# Patient Record
Sex: Female | Born: 1990 | Hispanic: Yes | Marital: Single | Smoking: Never smoker
Health system: Southern US, Community
[De-identification: ages and names within clinical notes are randomized; demographics above are authoritative.]

## PROBLEM LIST (undated history)

## (undated) DIAGNOSIS — Z789 Other specified health status: Secondary | ICD-10-CM

## (undated) HISTORY — PX: HERNIA REPAIR: SHX51

## (undated) HISTORY — DX: Other specified health status: Z78.9

---

## 2020-08-05 NOTE — Progress Notes (Signed)
Subjective:    Kristen Rice - 30 y.o. female MRN [id number]  Date of birth: Oct 29, 1990  HPI  Kristen Rice is to establish care. Patient has a PMH significant for none.   Current issues and/or concerns: 1. HIP PAIN: Duration: at least 7 months  Involved hip: left  Mechanism of injury: none Location: lateral Onset: gradual  Severity: 7/10  Quality: pinching , sharp and burning Frequency: intermittent Radiation: left lower back Aggravating factors: heavy lifting   Alleviating factors: ice  Status: fluctuating Treatments attempted: ice   Relief with NSAIDs?: No NSAIDs Taken Paresthesias / decreased sensation: no Swelling: no Redness:no Fevers: no  Comments: Requesting referral to Chiropractic.  2. 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 LIP SWELLING: Ongoing intermittently for at least 2 months. Happens suddenly. Burning and itching pain. Does not think this is related to food or drink. Denies changing shampoo, detergents, soaps, lotions, and perfumes. Denies difficulty breathing and chest pain when this occurs. Lasts about 3 days and then resolves.    ROS per HPI    Health Maintenance:  Health Maintenance Due  Topic Date Due  . Hepatitis C Screening  Never done  . HIV Screening  Never done    Past Medical History: There are no problems to display for this patient.  Social History   reports that she has never smoked. She has never used smokeless tobacco. She reports previous alcohol use. She reports that she does not use drugs.   Family History  Family history is unknown by patient.   Medications: reviewed and updated   Objective:   Physical Exam BP 113/77 (BP Location: Left Arm, Patient Position: Sitting)   Pulse 66   Ht 5' 4.33" (1.634 m)   Wt 159 lb 3.2 oz (72.2 kg)   SpO2 97%   BMI 27.05 kg/m  Physical Exam HENT:      Head: Normocephalic and atraumatic.     Nose: Nose normal.     Mouth/Throat:     Mouth: Mucous membranes are moist.     Pharynx: Oropharynx is clear.  Eyes:     Extraocular Movements: Extraocular movements intact.     Conjunctiva/sclera: Conjunctivae normal.     Pupils: Pupils are equal, round, and reactive to light.  Neurological:     Mental Status: She is alert.         Assessment & Plan:  1. Encounter to establish care: - Patient presents today to establish care.  - Return for annual physical examination, labs, and health maintenance. Arrive fasting meaning having no for at least 8 hours prior to appointment. You may have only water or black coffee. Please take scheduled medications as normal.  2. Chronic left-sided low back pain with left-sided sciatica: - Meloxicam as prescribed.  - Per patient request referral to Chiropractic for further evaluation and management. - Follow-up with primary provider as scheduled. - meloxicam (MOBIC) 7.5 MG tablet; Take 1 tablet (7.5 mg total) by mouth daily.  Dispense: 30 tablet; Refill: 0 - Ambulatory referral to Chiropractic  3. Bilateral carpal tunnel syndrome: - Meloxicam as prescribed.  - Follow-up with primary provider as scheduled. - meloxicam (MOBIC) 7.5 MG tablet; Take 1 tablet (7.5 mg total) by mouth daily.  Dispense: 30 tablet; Refill: 0  4. Lip swelling: - Ongoing intermittently for at least 2 months. Happens suddenly. Burning and itching pain. Does not think  this is related to food or drink. Denies changing shampoo, detergents, soaps, lotions, and perfumes. Denies difficulty breathing and chest pain when this occurs. Lasts about 3 days and then resolves.  - Suspicion for development of new onset allergies. - Referral to Allergy for further evaluation and  Management.  - Ambulatory referral to Allergy    Patient was given clear instructions to go to Emergency Department or return to medical center if symptoms don't improve,  worsen, or new problems develop.The patient verbalized understanding.  I discussed the assessment and treatment plan with the patient. The patient was provided an opportunity to ask questions and all were answered. The patient agreed with the plan and demonstrated an understanding of the instructions.   The patient was advised to call back or seek an in-person evaluation if the symptoms worsen or if the condition fails to improve as anticipated.    Ricky Stabs, NP 08/06/2020, 10:24 AM Primary Care at [facility]

## 2020-08-06 ENCOUNTER — Other Ambulatory Visit: Payer: Self-pay

## 2020-08-06 ENCOUNTER — Ambulatory Visit (INDEPENDENT_AMBULATORY_CARE_PROVIDER_SITE_OTHER): Payer: Medicaid Other | Admitting: Family

## 2020-08-06 ENCOUNTER — Encounter: Payer: Self-pay | Admitting: Family

## 2020-08-06 VITALS — BP 113/77 | HR 66 | Ht 64.33 in | Wt 159.2 lb

## 2020-08-06 DIAGNOSIS — R220 Localized swelling, mass and lump, head: Secondary | ICD-10-CM

## 2020-08-06 DIAGNOSIS — Z7689 Persons encountering health services in other specified circumstances: Secondary | ICD-10-CM

## 2020-08-06 DIAGNOSIS — G8929 Other chronic pain: Secondary | ICD-10-CM

## 2020-08-06 DIAGNOSIS — G5603 Carpal tunnel syndrome, bilateral upper limbs: Secondary | ICD-10-CM

## 2020-08-06 DIAGNOSIS — M5442 Lumbago with sciatica, left side: Secondary | ICD-10-CM | POA: Diagnosis not present

## 2020-08-06 MED ORDER — MELOXICAM 7.5 MG PO TABS: 7.5000 mg | ORAL_TABLET | Freq: Every day | ORAL | 0 refills | Status: DC

## 2020-08-06 NOTE — Progress Notes (Signed)
Establish care Pain radiating from left hip bone down leg Bottom lip swelling and redness around chin

## 2020-08-06 NOTE — Patient Instructions (Signed)
- Return for annual physical examination, labs, and health maintenance. Arrive fasting meaning having no for at least 8 hours prior to appointment. You may have only water or black coffee. Please take scheduled medications as normal. Thank you for choosing Primary [facility] at [facility] for your medical home!    Kristen Rice was seen by Rema Fendt, NP today.   Kristen Rice's primary care provider is Rema Fendt, NP.   For the best care possible,  you should try to see Ricky Stabs, NP whenever you come to clinic.   We look forward to seeing you again soon!  If you have any questions about your visit today,  please call us at [phone]  Or feel free to reach your provider via MyChart.    DASH Eating Plan DASH stands for Dietary Approaches to Stop Hypertension. The DASH eating plan is a healthy eating plan that has been shown to:  Reduce high blood pressure (hypertension).  Reduce your risk for type 2 diabetes, heart disease, and stroke.  Help with weight loss. What are tips for following this plan? Reading food labels  Check food labels for the amount of salt (sodium) per serving. Choose foods with less than 5 percent of the Daily Value of sodium. Generally, foods with less than 300 milligrams (mg) of sodium per serving fit into this eating plan.  To find whole grains, look for the word "whole" as the first word in the ingredient list. Shopping  Buy products labeled as "low-sodium" or "no salt added."  Buy fresh foods. Avoid canned foods and pre-made or frozen meals. Cooking  Avoid adding salt when cooking. Use salt-free seasonings or herbs instead of table salt or sea salt. Check with your health care provider or pharmacist before using salt substitutes.  Do not fry foods. Cook foods using healthy methods such as baking, boiling, grilling, roasting, and broiling instead.  Cook with heart-healthy oils, such as olive, canola, avocado, soybean, or sunflower  oil. Meal planning  Eat a balanced diet that includes: ? 4 or more servings of fruits and 4 or more servings of vegetables each day. Try to fill one-half of your plate with fruits and vegetables. ? 6-8 servings of whole grains each day. ? Less than 6 oz (170 g) of lean meat, poultry, or fish each day. A 3-oz (85-g) serving of meat is about the same size as a deck of cards. One egg equals 1 oz (28 g). ? 2-3 servings of low-fat dairy each day. One serving is 1 cup (237 mL). ? 1 serving of nuts, seeds, or beans 5 times each week. ? 2-3 servings of heart-healthy fats. Healthy fats called omega-3 fatty acids are found in foods such as walnuts, flaxseeds, fortified milks, and eggs. These fats are also found in cold-water fish, such as sardines, salmon, and mackerel.  Limit how much you eat of: ? Canned or prepackaged foods. ? Food that is high in trans fat, such as some fried foods. ? Food that is high in saturated fat, such as fatty meat. ? Desserts and other sweets, sugary drinks, and other foods with added sugar. ? Full-fat dairy products.  Do not salt foods before eating.  Do not eat more than 4 egg yolks a week.  Try to eat at least 2 vegetarian meals a week.  Eat more home-cooked food and less restaurant, buffet, and fast food.   Lifestyle  When eating at a restaurant, ask that your food be prepared  with less salt or no salt, if possible.  If you drink alcohol: ? Limit how much you use to:  0-1 drink a day for women who are not pregnant.  0-2 drinks a day for men. ? Be aware of how much alcohol is in your drink. In the [place] one drink equals one 12 oz bottle of beer (355 mL), one 5 oz glass of wine (148 mL), or one 1 oz glass of hard liquor (44 mL). General information  Avoid eating more than 2,300 mg of salt a day. If you have hypertension, you may need to reduce your sodium intake to 1,500 mg a day.  Work with your health care provider to maintain a healthy body weight or  to lose weight. Ask what an ideal weight is for you.  Get at least 30 minutes of exercise that causes your heart to beat faster (aerobic exercise) most days of the week. Activities may include walking, swimming, or biking.  Work with your health care provider or dietitian to adjust your eating plan to your individual calorie needs. What foods should I eat? Fruits All fresh, dried, or frozen fruit. Canned fruit in natural juice (without added sugar). Vegetables Fresh or frozen vegetables (raw, steamed, roasted, or grilled). Low-sodium or reduced-sodium tomato and vegetable juice. Low-sodium or reduced-sodium tomato sauce and tomato paste. Low-sodium or reduced-sodium canned vegetables. Grains Whole-grain or whole-wheat bread. Whole-grain or whole-wheat pasta. Brown rice. [place]. Bulgur. Whole-grain and low-sodium cereals. Pita bread. Low-fat, low-sodium crackers. Whole-wheat flour tortillas. Meats and other proteins Skinless chicken or [place]. Ground chicken or [place]. Pork with fat trimmed off. Fish and seafood. Egg whites. Dried beans, peas, or lentils. Unsalted nuts, nut butters, and seeds. Unsalted canned beans. Lean cuts of beef with fat trimmed off. Low-sodium, lean precooked or cured meat, such as sausages or meat loaves. Dairy Low-fat (1%) or fat-free (skim) milk. Reduced-fat, low-fat, or fat-free cheeses. Nonfat, low-sodium ricotta or cottage cheese. Low-fat or nonfat yogurt. Low-fat, low-sodium cheese. Fats and oils Soft margarine without trans fats. Vegetable oil. Reduced-fat, low-fat, or light mayonnaise and salad dressings (reduced-sodium). Canola, safflower, olive, avocado, soybean, and sunflower oils. Avocado. Seasonings and condiments Herbs. Spices. Seasoning mixes without salt. Other foods Unsalted popcorn and pretzels. Fat-free sweets. The items listed above may not be a complete list of foods and beverages you can eat. Contact a dietitian for more information. What  foods should I avoid? Fruits Canned fruit in a light or heavy syrup. Fried fruit. Fruit in cream or butter sauce. Vegetables Creamed or fried vegetables. Vegetables in a cheese sauce. Regular canned vegetables (not low-sodium or reduced-sodium). Regular canned tomato sauce and paste (not low-sodium or reduced-sodium). Regular tomato and vegetable juice (not low-sodium or reduced-sodium). Angie Fava. Olives. Grains Baked goods made with fat, such as croissants, muffins, or some breads. Dry pasta or rice meal packs. Meats and other proteins Fatty cuts of meat. Ribs. Fried meat. Berniece Salines. Bologna, salami, and other precooked or cured meats, such as sausages or meat loaves. Fat from the back of a pig (fatback). Bratwurst. Salted nuts and seeds. Canned beans with added salt. Canned or smoked fish. Whole eggs or egg yolks. Chicken or [place] with skin. Dairy Whole or 2% milk, cream, and half-and-half. Whole or full-fat cream cheese. Whole-fat or sweetened yogurt. Full-fat cheese. Nondairy creamers. Whipped toppings. Processed cheese and cheese spreads. Fats and oils Butter. Stick margarine. Lard. Shortening. Ghee. Bacon fat. Tropical oils, such as coconut, palm kernel, or palm oil. Seasonings and condiments  Onion salt, garlic salt, seasoned salt, table salt, and sea salt. Worcestershire sauce. Tartar sauce. Barbecue sauce. Teriyaki sauce. Soy sauce, including reduced-sodium. Steak sauce. Canned and packaged gravies. Fish sauce. Oyster sauce. Cocktail sauce. Store-bought horseradish. Ketchup. Mustard. Meat flavorings and tenderizers. Bouillon cubes. Hot sauces. Pre-made or packaged marinades. Pre-made or packaged taco seasonings. Relishes. Regular salad dressings. Other foods Salted popcorn and pretzels. The items listed above may not be a complete list of foods and beverages you should avoid. Contact a dietitian for more information. Where to find more information  National Heart, Lung, and Blood Institute:  https://wilson-eaton.com/  American Heart Association: www.heart.org  Academy of Nutrition and Dietetics: www.eatright.[place]: www.kidney.org Summary  The DASH eating plan is a healthy eating plan that has been shown to reduce high blood pressure (hypertension). It may also reduce your risk for type 2 diabetes, heart disease, and stroke.  When on the DASH eating plan, aim to eat more fresh fruits and vegetables, whole grains, lean proteins, low-fat dairy, and heart-healthy fats.  With the DASH eating plan, you should limit salt (sodium) intake to 2,300 mg a day. If you have hypertension, you may need to reduce your sodium intake to 1,500 mg a day.  Work with your health care provider or dietitian to adjust your eating plan to your individual calorie needs. This information is not intended to replace advice given to you by your health care provider. Make sure you discuss any questions you have with your health care provider. Document Revised: 03/09/2019 Document Reviewed: 03/09/2019 Elsevier Patient Education  2021 [employer].

## 2020-08-12 ENCOUNTER — Ambulatory Visit: Payer: Medicaid Other | Admitting: Allergy and Immunology

## 2020-08-20 DIAGNOSIS — L209 Atopic dermatitis, unspecified: Secondary | ICD-10-CM | POA: Diagnosis not present

## 2020-08-20 DIAGNOSIS — L811 Chloasma: Secondary | ICD-10-CM | POA: Diagnosis not present

## 2020-08-20 DIAGNOSIS — L299 Pruritus, unspecified: Secondary | ICD-10-CM | POA: Diagnosis not present

## 2020-10-21 ENCOUNTER — Ambulatory Visit: Payer: Medicaid Other | Admitting: Allergy and Immunology

## 2020-10-25 DIAGNOSIS — L209 Atopic dermatitis, unspecified: Secondary | ICD-10-CM | POA: Diagnosis not present

## 2020-10-25 DIAGNOSIS — L299 Pruritus, unspecified: Secondary | ICD-10-CM | POA: Diagnosis not present

## 2020-11-11 ENCOUNTER — Ambulatory Visit: Payer: Medicaid Other | Admitting: Physician Assistant

## 2020-11-19 ENCOUNTER — Ambulatory Visit
Admission: EM | Admit: 2020-11-19 | Discharge: 2020-11-19 | Disposition: A | Discharge status: Home / Self Care | Payer: Medicaid Other | Attending: Family Medicine | Admitting: Family Medicine

## 2020-11-19 ENCOUNTER — Other Ambulatory Visit: Payer: Self-pay

## 2020-11-19 DIAGNOSIS — L237 Allergic contact dermatitis due to plants, except food: Secondary | ICD-10-CM | POA: Diagnosis not present

## 2020-11-19 DIAGNOSIS — H65191 Other acute nonsuppurative otitis media, right ear: Secondary | ICD-10-CM | POA: Diagnosis not present

## 2020-11-19 MED ORDER — TRIAMCINOLONE ACETONIDE 0.1 % EX CREA: 1.0000 "application " | TOPICAL_CREAM | Freq: Two times a day (BID) | CUTANEOUS | 0 refills | Status: DC

## 2020-11-19 MED ORDER — PREDNISONE 10 MG PO TABS: ORAL_TABLET | ORAL | 0 refills | Status: DC

## 2020-11-19 NOTE — ED Provider Notes (Signed)
[facility]    CSN: [id number] Arrival date & time: 11/19/20  1119      History   Chief Complaint Chief Complaint  Patient presents with   Rash    HPI Kristen Rice is a 30 y.o. female.   Patient presenting today with 3-day history of itchy rash to bilateral lower extremities, upper extremities that is spreading over time.  Denies pain, drainage, known new exposures, chest pain, shortness of breath, history of dermatologic issues.  Has not tried anything over-the-counter for symptoms thus far.  Also states her right ear has been clogged with pressure and popping sensations for almost 10 days now.  Has not been tried anything over-the-counter for this either.  Denies fever, chills, headache, sharp constant pain, recent illness.   Past Medical History:  Diagnosis Date   No pertinent past medical history     There are no problems to display for this patient.   Past Surgical History:  Procedure Laterality Date   HERNIA REPAIR      OB History   No obstetric history on file.      Home Medications    Prior to Admission medications   Medication Sig Start Date End Date Taking? Authorizing Provider  predniSONE (DELTASONE) 10 MG tablet Take 6 tabs daily with breakfast x 2 days, 5 tabs daily x 2 days, 4 tabs daily x 2 days, etc 11/19/20  Yes Particia Nearing, PA-C  triamcinolone cream (KENALOG) 0.1 % Apply 1 application topically 2 (two) times daily. 11/19/20  Yes Particia Nearing, PA-C    Family History Family History  Family history unknown: Yes    Social History Social History   Tobacco Use   Smoking status: Never   Smokeless tobacco: Never  Vaping Use   Vaping Use: Never used  Substance Use Topics   Alcohol use: Not Currently   Drug use: Never     Allergies   Patient has no known allergies.   Review of Systems Review of Systems Per HPI  Physical Exam Triage Vital Signs ED Triage Vitals  Enc Vitals Group     BP 11/19/20 1257  134/74     Pulse Rate 11/19/20 1257 76     Resp 11/19/20 1257 18     Temp 11/19/20 1257 98.4 F (36.9 C)     Temp Source 11/19/20 1257 Oral     SpO2 11/19/20 1257 96 %     Weight --      Height --      Head Circumference --      Peak Flow --      Pain Score 11/19/20 1258 0     Pain Loc --      Pain Edu? --      Excl. in GC? --    No data found.  Updated Vital Signs BP 134/74 (BP Location: Left Arm)   Pulse 76   Temp 98.4 F (36.9 C) (Oral)   Resp 18   SpO2 96%   Visual Acuity Right Eye Distance:   Left Eye Distance:   Bilateral Distance:    Right Eye Near:   Left Eye Near:    Bilateral Near:     Physical Exam Vitals and nursing note reviewed.  Constitutional:      Appearance: Normal appearance. She is not ill-appearing.  HENT:     Head: Atraumatic.     Ears:     Comments: Bilateral middle ear effusion, no erythema or abnormal canal findings  Eyes:     Extraocular Movements: Extraocular movements intact.     Conjunctiva/sclera: Conjunctivae normal.  Cardiovascular:     Rate and Rhythm: Normal rate and regular rhythm.     Heart sounds: Normal heart sounds.  Pulmonary:     Effort: Pulmonary effort is normal.     Breath sounds: Normal breath sounds.  Musculoskeletal:        General: Normal range of motion.     Cervical back: Normal range of motion and neck supple.  Skin:    General: Skin is warm and dry.     Findings: Rash present. No erythema.     Comments: Erythematous maculopapular rash, linear and in clusters of bilateral medial legs and arms  Neurological:     Mental Status: She is alert and oriented to person, place, and time.  Psychiatric:        Mood and Affect: Mood normal.        Thought Content: Thought content normal.        Judgment: Judgment normal.     UC Treatments / Results  Labs (all labs ordered are listed, but only abnormal results are displayed) Labs Reviewed - No data to display  EKG   Radiology No results  found.  Procedures Procedures (including critical care time)  Medications Ordered in UC Medications - No data to display  Initial Impression / Assessment and Plan / UC Course  I have reviewed the triage vital signs and the nursing notes.  Pertinent labs & imaging results that were available during my care of the patient were reviewed by me and considered in my medical decision making (see chart for details).     Suspect poison ivy dermatitis for her itchy rash, will treat with extended prednisone taper given continued spread and widespread nature of distribution.  Triamcinolone cream additionally for spot treatment.  Discussed that the prednisone will also help open up her eustachian tubes which will help with her ear pressure and popping sensation.  Follow-up if worsening or not resolving  Final Clinical Impressions(s) / UC Diagnoses   Final diagnoses:  Acute middle ear effusion, right  Poison ivy dermatitis   Discharge Instructions   None    ED Prescriptions     Medication Sig Dispense Auth. Provider   predniSONE (DELTASONE) 10 MG tablet Take 6 tabs daily with breakfast x 2 days, 5 tabs daily x 2 days, 4 tabs daily x 2 days, etc 42 tablet Particia Nearing, PA-C   triamcinolone cream (KENALOG) 0.1 % Apply 1 application topically 2 (two) times daily. 90 g Particia Nearing, [place]      PDMP not reviewed this encounter.   Particia Nearing, [place] 11/19/20 1402

## 2020-11-19 NOTE — ED Triage Notes (Signed)
Pt c/o itchy red raised rash to arms and legs x3 days. Pt c/o rt ear clogged x8 days.

## 2020-12-09 ENCOUNTER — Ambulatory Visit: Payer: Medicaid Other | Admitting: Allergy

## 2020-12-09 NOTE — Progress Notes (Deleted)
New Patient Note  RE: Kristen Rice MRN: [id number] DOB: 18-Jul-1990 Date of Office Visit: 12/09/2020  Consult requested by: Rema Fendt, NP Primary care provider: Rema Fendt, NP  Chief Complaint: No chief complaint on file.  History of Present Illness: I had the pleasure of seeing Kristen Rice for initial evaluation at the [facility] on 12/09/2020. She is a 30 y.o. 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  Swelling started about *** ago. Mainly occurs on her ***. Describes them as ***. Individual swelling episodes last about ***. No ecchymosis upon resolution. Associated symptoms include: ***.  Frequency of episodes: ***. Suspected triggers are ***. Denies any *** fevers, chills, changes in medications, foods, personal care products or recent infections. She has tried the following therapies: *** with *** benefit. Systemic steroids ***. Currently on ***.  Previous work up includes: ***. Previous history of swelling: {Blank single:19197::"yes","no"}. Family history of angioedema: {Blank single:19197::"yes","no"}. Patient is up to date with the following cancer screening tests: ***. Ace-inhibitor use: {Blank single:19197::"yes","no"}  08/06/2020 PCP visit: "3. LIP SWELLING: Ongoing intermittently for at least 2 months. Happens suddenly. Burning and itching pain. Does not think this is related to food or drink. Denies changing shampoo, detergents, soaps, lotions, and perfumes. Denies difficulty breathing and chest pain when this occurs. Lasts about 3 days and then resolves.  - Ongoing intermittently for at least 2 months. Happens suddenly. Burning and itching pain. Does not think this is related to food or drink. Denies changing shampoo, detergents, soaps, lotions, and perfumes. Denies difficulty breathing and chest pain when this occurs. Lasts about 3 days and then resolves.  - Suspicion for development of new  onset allergies. - Referral to Allergy for further evaluation and  Management.  - Ambulatory referral to Allergy "  Assessment and Plan: Kristen Rice is a 30 y.o. female with: No problem-specific Assessment & Plan notes found for this encounter.  No follow-ups on file.  No orders of the defined types were placed in this encounter.  Lab Orders  No laboratory test(s) ordered today    Other allergy screening: Asthma: {Blank single:19197::"yes","no"} Rhino conjunctivitis: {Blank single:19197::"yes","no"} Food allergy: {Blank single:19197::"yes","no"} Medication allergy: {Blank single:19197::"yes","no"} Hymenoptera allergy: {Blank single:19197::"yes","no"} Urticaria: {Blank single:19197::"yes","no"} Eczema:{Blank single:19197::"yes","no"} History of recurrent infections suggestive of immunodeficency: {Blank single:19197::"yes","no"}  Diagnostics: Spirometry:  Tracings reviewed. Her effort: {Blank single:19197::"Good reproducible efforts.","It was hard to get consistent efforts and there is a question as to whether this reflects a maximal maneuver.","Poor effort, data can not be interpreted."} FVC: ***L FEV1: ***L, ***% predicted FEV1/FVC ratio: ***% Interpretation: {Blank single:19197::"Spirometry consistent with mild obstructive disease","Spirometry consistent with moderate obstructive disease","Spirometry consistent with severe obstructive disease","Spirometry consistent with possible restrictive disease","Spirometry consistent with mixed obstructive and restrictive disease","Spirometry uninterpretable due to technique","Spirometry consistent with normal pattern","No overt abnormalities noted given today's efforts"}.  Please see scanned spirometry results for details.  Skin Testing: {Blank single:19197::"Select foods","Environmental allergy panel","Environmental allergy panel and select foods","Food allergy panel","None","Deferred due to recent antihistamines use"}. *** Results discussed  with patient/family.   Past Medical History: There are no problems to display for this patient.  Past Medical History:  Diagnosis Date  . No pertinent past medical history    Past Surgical History: Past Surgical History:  Procedure Laterality Date  . HERNIA REPAIR     Medication List:  Current Outpatient Medications  Medication Sig Dispense Refill  . predniSONE (DELTASONE) 10 MG tablet Take  6 tabs daily with breakfast x 2 days, 5 tabs daily x 2 days, 4 tabs daily x 2 days, etc 42 tablet 0  . triamcinolone cream (KENALOG) 0.1 % Apply 1 application topically 2 (two) times daily. 90 g 0   No current facility-administered medications for this visit.   Allergies: No Known Allergies Social History: Social History   Socioeconomic History  . Marital status: Single    Spouse name: Not on file  . Number of children: Not on file  . Years of education: Not on file  . Highest education level: Not on file  Occupational History  . Not on file  Tobacco Use  . Smoking status: Never  . Smokeless tobacco: Never  Vaping Use  . Vaping Use: Never used  Substance and Sexual Activity  . Alcohol use: Not Currently  . Drug use: Never  . Sexual activity: Yes    Birth control/protection: I.U.D.  Other Topics Concern  . Not on file  Social History Narrative  . Not on file   Social Determinants of Health   Financial Resource Strain: Not on file  Food Insecurity: Not on file  Transportation Needs: Not on file  Physical Activity: Not on file  Stress: Not on file  Social Connections: Not on file   Lives in a ***. Smoking: *** Occupation: ***  Environmental HistorySurveyor, minerals in the house: Copywriter, advertising in the family room: {Blank single:19197::"yes","no"} Carpet in the bedroom: {Blank single:19197::"yes","no"} Heating: {Blank single:19197::"electric","gas","heat pump"} Cooling: {Blank single:19197::"central","window","heat pump"} Pet: {Blank  single:19197::"yes ***","no"}  Family History: Family History  Family history unknown: Yes   Problem                               Relation Asthma                                   *** Eczema                                *** Food allergy                          *** Allergic rhino conjunctivitis     ***  Review of Systems  Constitutional:  Negative for appetite change, chills, fever and unexpected weight change.  HENT:  Negative for congestion and rhinorrhea.   Eyes:  Negative for itching.  Respiratory:  Negative for cough, chest tightness, shortness of breath and wheezing.   Cardiovascular:  Negative for chest pain.  Gastrointestinal:  Negative for abdominal pain.  Genitourinary:  Negative for difficulty urinating.  Skin:  Negative for rash.  Neurological:  Negative for headaches.   Objective: There were no vitals taken for this visit. There is no height or weight on file to calculate BMI. Physical Exam Vitals and nursing note reviewed.  Constitutional:      Appearance: Normal appearance. She is well-developed.  HENT:     Head: Normocephalic and atraumatic.     Right Ear: Tympanic membrane and external ear normal.     Left Ear: Tympanic membrane and external ear normal.     Nose: Nose normal.     Mouth/Throat:     Mouth: Mucous membranes are moist.     Pharynx: Oropharynx is clear.  Eyes:     Conjunctiva/sclera: Conjunctivae normal.  Cardiovascular:     Rate and Rhythm: Normal rate and regular rhythm.     Heart sounds: Normal heart sounds. No murmur heard.   No friction rub. No gallop.  Pulmonary:     Effort: Pulmonary effort is normal.     Breath sounds: Normal breath sounds. No wheezing, rhonchi or rales.  Musculoskeletal:     Cervical back: Neck supple.  Skin:    General: Skin is warm.     Findings: No rash.  Neurological:     Mental Status: She is alert and oriented to person, place, and time.  Psychiatric:        Behavior: Behavior normal.  The plan was  reviewed with the patient/family, and all questions/concerned were addressed.  It was my pleasure to see Kristen Rice today and participate in her care. Please feel free to contact me with any questions or concerns.  Sincerely,  Wyline Mood, DO Allergy & Immunology  [facility] office: [phone] [facility] office: [phone]

## 2020-12-11 ENCOUNTER — Emergency Department (HOSPITAL_COMMUNITY)
Admission: EM | Admit: 2020-12-11 | Discharge: 2020-12-12 | Disposition: A | Discharge status: Home / Self Care | Payer: Medicaid Other | Attending: Emergency Medicine | Admitting: Emergency Medicine

## 2020-12-11 DIAGNOSIS — R1012 Left upper quadrant pain: Secondary | ICD-10-CM | POA: Insufficient documentation

## 2020-12-11 DIAGNOSIS — R109 Unspecified abdominal pain: Secondary | ICD-10-CM

## 2020-12-11 LAB — URINALYSIS, ROUTINE W REFLEX MICROSCOPIC
Bilirubin Urine: NEGATIVE
Glucose, UA: NEGATIVE mg/dL
Hgb urine dipstick: NEGATIVE
Ketones, ur: NEGATIVE mg/dL
Leukocytes,Ua: NEGATIVE
Nitrite: NEGATIVE
Protein, ur: NEGATIVE mg/dL
Specific Gravity, Urine: 1.02 (ref 1.005–1.030)
pH: 6 (ref 5.0–8.0)

## 2020-12-11 LAB — COMPREHENSIVE METABOLIC PANEL
ALT: 16 U/L (ref 0–44)
AST: 20 U/L (ref 15–41)
Albumin: 4 g/dL (ref 3.5–5.0)
Alkaline Phosphatase: 46 U/L (ref 38–126)
Anion gap: 6 (ref 5–15)
BUN: 16 mg/dL (ref 6–20)
CO2: 24 mmol/L (ref 22–32)
Calcium: 8.8 mg/dL — ABNORMAL LOW (ref 8.9–10.3)
Chloride: 105 mmol/L (ref 98–111)
Creatinine, Ser: 0.54 mg/dL (ref 0.44–1.00)
GFR, Estimated: >60 mL/min (ref >60)
Glucose, Bld: 100 mg/dL — ABNORMAL HIGH (ref 70–99)
Potassium: 3.8 mmol/L (ref 3.5–5.1)
Sodium: 135 mmol/L (ref 135–145)
Total Bilirubin: 0.7 mg/dL (ref 0.3–1.2)
Total Protein: 6.9 g/dL (ref 6.5–8.1)

## 2020-12-11 LAB — CBC WITH DIFFERENTIAL/PLATELET
Abs Immature Granulocytes: 0.01 10*3/uL (ref 0.00–0.07)
Basophils Absolute: 0 10*3/uL (ref 0.0–0.1)
Basophils Relative: 0 %
Eosinophils Absolute: 0.1 10*3/uL (ref 0.0–0.5)
Eosinophils Relative: 2 %
HCT: 41.7 % (ref 36.0–46.0)
Hemoglobin: 14.7 g/dL (ref 12.0–15.0)
Immature Granulocytes: 0 %
Lymphocytes Relative: 30 %
Lymphs Abs: 1.8 10*3/uL (ref 0.7–4.0)
MCH: 31.3 pg (ref 26.0–34.0)
MCHC: 35.3 g/dL (ref 30.0–36.0)
MCV: 88.9 fL (ref 80.0–100.0)
Monocytes Absolute: 0.4 10*3/uL (ref 0.1–1.0)
Monocytes Relative: 6 %
Neutro Abs: 3.6 10*3/uL (ref 1.7–7.7)
Neutrophils Relative %: 62 %
Platelets: 237 10*3/uL (ref 150–400)
RBC: 4.69 MIL/uL (ref 3.87–5.11)
RDW: 13.4 % (ref 11.5–15.5)
WBC: 5.9 10*3/uL (ref 4.0–10.5)
nRBC: 0 % (ref 0.0–0.2)

## 2020-12-11 LAB — LIPASE, BLOOD: Lipase: 33 U/L (ref 11–51)

## 2020-12-11 NOTE — Progress Notes (Signed)
Patient called at 42 and 1611. Voicemail left with both calls. [employer] assisted with today's call. Interpreter Name: Genella Rife, [place] #: [phone].

## 2020-12-11 NOTE — ED Provider Notes (Signed)
Emergency Medicine Provider Triage Evaluation Note  Kristen Rice , a 30 y.o. female  was evaluated in triage.  Pt complains of abd pain.  Review of Systems  Positive: Abd pain Negative: Fever, chills, dysuria, n/v/d/c, cp, sob  Physical Exam  BP 118/87   Pulse 78   Temp 98.6 F (37 C) (Oral)   Resp 16   SpO2 97%  Gen:   Awake, no distress   Resp:  Normal effort  MSK:   Moves extremities without difficulty  Other:  TTP LLQ  Medical Decision Making  Medically screening exam initiated at 7:59 PM.  Appropriate orders placed.  Kristen Rice was informed that the remainder of the evaluation will be completed by another provider, this initial triage assessment does not replace that evaluation, and the importance of remaining in the ED until their evaluation is complete.  Pt here with recurrent LLQ tenderness x 1 week.  No other associated sxs.  Sts she had had an endoscopy and colonoscopy in [place] earlier this year and it was normal.  Currently rates pain as 7/10.     Fayrene Helper, PA-C 12/11/20 2001    Koleen Distance, MD 12/11/20 2132

## 2020-12-11 NOTE — ED Provider Notes (Signed)
[facility] EMERGENCY DEPARTMENT Provider Note   CSN: [id number] Arrival date & time: 12/11/20  1938     History Chief Complaint  Patient presents with   Abdominal Pain   Back Pain    Kristen Rice is a 30 y.o. female.  30 year old female who presents to the emergency department today with persistent left upper quadrant abdominal pain.  Patient states that she has had this pain off and on for the last 6 months but it seems like over the last month and specifically over the last 10 days has become much more consistent and prominent.  At sharp sometimes pressure times sometimes bloating type pain.  She stated she had endoscopy in [place] when it started and this was negative for any ulcers or other abnormalities.  She states she had blood work done a couple times which was normal.  She is not on any medications for right now.  No nausea or vomiting.  No diarrhea or constipation.  No significant weight loss or weight gain.  At this moment she is asymptomatic patient stated when she came here about 4 5 hours ago she did have pain.  Is not related to eating.  No alcohol, tobacco or anti-inflammatories. No vaginal symptoms or urinary symptoms.   Abdominal Pain Back Pain Associated symptoms: abdominal pain       Past Medical History:  Diagnosis Date   No pertinent past medical history     There are no problems to display for this patient.   Past Surgical History:  Procedure Laterality Date   HERNIA REPAIR       OB History   No obstetric history on file.     Family History  Family history unknown: Yes    Social History   Tobacco Use   Smoking status: Never   Smokeless tobacco: Never  Vaping Use   Vaping Use: Never used  Substance Use Topics   Alcohol use: Not Currently   Drug use: Never    Home Medications Prior to Admission medications   Medication Sig Start Date End Date Taking? Authorizing Provider  Multiple Vitamin (MULTIVITAMIN WITH  MINERALS) TABS tablet Take 1 tablet by mouth daily.   Yes [provider]  pantoprazole (PROTONIX) 20 MG tablet Take 1 tablet (20 mg total) by mouth 2 (two) times daily for 14 days. 12/12/20 12/26/20 Yes Colby Catanese, Barbara Cower, MD  predniSONE (DELTASONE) 10 MG tablet Take 6 tabs daily with breakfast x 2 days, 5 tabs daily x 2 days, 4 tabs daily x 2 days, etc Patient not taking: Reported on 12/12/2020 11/19/20   Particia Nearing, PA-C  triamcinolone cream (KENALOG) 0.1 % Apply 1 application topically 2 (two) times daily. Patient not taking: Reported on 12/12/2020 11/19/20   Particia Nearing, PA-C    Allergies    Patient has no known allergies.  Review of Systems   Review of Systems  Gastrointestinal:  Positive for abdominal pain.  Musculoskeletal:  Positive for back pain.  All other systems reviewed and are negative.  Physical Exam Updated Vital Signs BP 109/72   Pulse (!) 59   Temp 98.8 F (37.1 C) (Oral)   Resp (!) 26   SpO2 99%   Physical Exam Vitals and nursing note reviewed.  Constitutional:      Appearance: She is well-developed.  HENT:     Head: Normocephalic and atraumatic.  Cardiovascular:     Rate and Rhythm: Normal rate and regular rhythm.  Pulmonary:  Effort: Pulmonary effort is normal. No respiratory distress.     Breath sounds: No stridor.  Abdominal:     General: Abdomen is flat. There is no distension.     Tenderness: There is abdominal tenderness in the left upper quadrant.  Musculoskeletal:     Cervical back: Normal range of motion.  Neurological:     Mental Status: She is alert.    ED Results / Procedures / Treatments   Labs (all labs ordered are listed, but only abnormal results are displayed) Labs Reviewed  COMPREHENSIVE METABOLIC PANEL - Abnormal; Notable for the following components:      Result Value   Glucose, Bld 100 (*)    Calcium 8.8 (*)    All other components within normal limits  CBC WITH DIFFERENTIAL/PLATELET  LIPASE, BLOOD   URINALYSIS, ROUTINE W REFLEX MICROSCOPIC  PREGNANCY, URINE    EKG None  Radiology CT ABDOMEN PELVIS W CONTRAST  Result Date: 12/12/2020 CLINICAL DATA:  Abdominal abscess/infection suspected. Unspecified abdominal pain. EXAM: CT ABDOMEN AND PELVIS WITH CONTRAST TECHNIQUE: Multidetector CT imaging of the abdomen and pelvis was performed using the standard protocol following bolus administration of intravenous contrast. CONTRAST:  64mL OMNIPAQUE IOHEXOL 350 MG/ML SOLN COMPARISON:  None. FINDINGS: Lower chest: No acute abnormality. Hepatobiliary: No focal liver abnormality is seen. Status post cholecystectomy. No biliary dilatation. Pancreas: Unremarkable Spleen: Unremarkable Adrenals/Urinary Tract: Adrenal glands are unremarkable. Kidneys are normal, without renal calculi, focal lesion, or hydronephrosis. Bladder is unremarkable. Stomach/Bowel: Stomach is within normal limits. Appendix appears normal. No evidence of bowel wall thickening, distention, or inflammatory changes. No free intraperitoneal gas or fluid. Vascular/Lymphatic: No significant vascular findings are present. No enlarged abdominal or pelvic lymph nodes. Reproductive: Intrauterine device in expected position within the endometrial cavity. Pelvic organs are otherwise unremarkable. Other: Umbilical hernia repair utilizing mesh has been performed. Rectum unremarkable. Musculoskeletal: No acute bone abnormality. No lytic or blastic bone lesions are identified. IMPRESSION: No acute intra-abdominal pathology identified. No definite radiographic explanation for the patient's reported symptoms. Electronically Signed   By: Helyn Numbers M.D.   On: 12/12/2020 03:56    Procedures Procedures   Medications Ordered in ED Medications  pantoprazole (PROTONIX) EC tablet 80 mg (80 mg Oral Given 12/12/20 0447)  iohexol (OMNIPAQUE) 350 MG/ML injection 75 mL (75 mLs Intravenous Contrast Given 12/12/20 0335)    ED Course  I have reviewed the triage  vital signs and the nursing notes.  Pertinent labs & imaging results that were available during my care of the patient were reviewed by me and considered in my medical decision making (see chart for details).    MDM Rules/Calculators/A&P                         Seems like indigestion/GERD but with the length of symptoms, worsening, negative EGD, will ct to ensure not splenic/pancreatic abnormality.   Ct ok. Will initiate PPI's.   Final Clinical Impression(s) / ED Diagnoses Final diagnoses:  Abdominal pain, unspecified abdominal location    Rx / DC Orders ED Discharge Orders          Ordered    pantoprazole (PROTONIX) 20 MG tablet  2 times daily        12/12/20 0440             Krystian Ferrentino, Barbara Cower, MD 12/12/20 [phone]

## 2020-12-11 NOTE — ED Triage Notes (Signed)
Pt c/o L sided abd pain, back pain x1wk. Denies fevers, chills, hematuria, dysuria. Denies injury to area, denies hx  8/10 swelling, pressure

## 2020-12-12 ENCOUNTER — Emergency Department (HOSPITAL_COMMUNITY): Payer: Medicaid Other

## 2020-12-12 DIAGNOSIS — R109 Unspecified abdominal pain: Secondary | ICD-10-CM | POA: Diagnosis not present

## 2020-12-12 LAB — PREGNANCY, URINE: Preg Test, Ur: NEGATIVE

## 2020-12-12 MED ORDER — PANTOPRAZOLE SODIUM 20 MG PO TBEC: 20.0000 mg | DELAYED_RELEASE_TABLET | Freq: Two times a day (BID) | ORAL | 0 refills | Status: DC

## 2020-12-12 MED ORDER — IOHEXOL 350 MG/ML SOLN
75.0000 mL | Freq: Once | INTRAVENOUS | Status: AC | PRN
  Administered 2020-12-12: 75 mL via INTRAVENOUS

## 2020-12-12 MED ORDER — PANTOPRAZOLE SODIUM 40 MG PO TBEC
80.0000 mg | DELAYED_RELEASE_TABLET | Freq: Every day | ORAL | Status: DC
  Administered 2020-12-12: 80 mg via ORAL
  Filled 2020-12-12: qty 2

## 2020-12-12 NOTE — ED Notes (Signed)
Patient verbalizes understanding of discharge instructions. Prescriptions reviewed. Opportunity for questioning and answers were provided. Armband removed by staff, pt discharged from ED ambulatory. ° °

## 2020-12-15 ENCOUNTER — Telehealth: Payer: Self-pay | Admitting: *Deleted

## 2020-12-15 ENCOUNTER — Encounter: Payer: Medicaid Other | Admitting: Family

## 2020-12-15 ENCOUNTER — Other Ambulatory Visit: Payer: Self-pay

## 2020-12-15 DIAGNOSIS — H5213 Myopia, bilateral: Secondary | ICD-10-CM | POA: Diagnosis not present

## 2020-12-15 DIAGNOSIS — Z789 Other specified health status: Secondary | ICD-10-CM

## 2020-12-15 NOTE — Telephone Encounter (Signed)
Transition Care Management Unsuccessful Follow-up Telephone Call  Date of discharge and from where:  12/12/2020 [facility]  Attempts:  1st Attempt  Reason for unsuccessful TCM follow-up call:  Left voice message via Spanish interpreter

## 2020-12-16 NOTE — Telephone Encounter (Signed)
Transition Care Management Follow-up Telephone Call Date of discharge and from where: 12/12/2020 - [facility] How have you been since you were released from the hospital? "I am okay" Any questions or concerns? No  Items Reviewed: Did the pt receive and understand the discharge instructions provided? Yes  Medications obtained and verified? Yes  Other? No  Any new allergies since your discharge? No  Dietary orders reviewed? No Do you have support at home? Yes    Functional Questionnaire: (I = Independent and D = Dependent) ADLs: I  Bathing/Dressing- I  Meal Prep- I  Eating- I  Maintaining continence- I  Transferring/Ambulation- I  Managing Meds- I  Follow up appointments reviewed:  PCP Hospital f/u appt confirmed? No  Specialist Hospital f/u appt confirmed? No   Are transportation arrangements needed? No  If their condition worsens, is the pt aware to call PCP or go to the Emergency Dept.? Yes Was the patient provided with contact information for the PCP's office or ED? Yes Was to pt encouraged to call back with questions or concerns? Yes  

## 2021-06-23 ENCOUNTER — Other Ambulatory Visit: Payer: Self-pay | Admitting: Nurse Practitioner

## 2021-06-23 ENCOUNTER — Telehealth: Payer: Self-pay

## 2021-06-23 ENCOUNTER — Other Ambulatory Visit: Payer: Self-pay

## 2021-06-23 ENCOUNTER — Encounter: Payer: Self-pay | Admitting: Family

## 2021-06-23 ENCOUNTER — Ambulatory Visit (INDEPENDENT_AMBULATORY_CARE_PROVIDER_SITE_OTHER): Payer: Medicaid Other | Admitting: Nurse Practitioner

## 2021-06-23 ENCOUNTER — Encounter: Payer: Self-pay | Admitting: Nurse Practitioner

## 2021-06-23 ENCOUNTER — Ambulatory Visit (INDEPENDENT_AMBULATORY_CARE_PROVIDER_SITE_OTHER): Payer: Medicaid Other

## 2021-06-23 VITALS — BP 121/73 | HR 77 | Resp 18

## 2021-06-23 DIAGNOSIS — M25521 Pain in right elbow: Secondary | ICD-10-CM

## 2021-06-23 DIAGNOSIS — M25552 Pain in left hip: Secondary | ICD-10-CM | POA: Diagnosis not present

## 2021-06-23 DIAGNOSIS — G8929 Other chronic pain: Secondary | ICD-10-CM

## 2021-06-23 DIAGNOSIS — L989 Disorder of the skin and subcutaneous tissue, unspecified: Secondary | ICD-10-CM

## 2021-06-23 DIAGNOSIS — M25511 Pain in right shoulder: Secondary | ICD-10-CM

## 2021-06-23 MED ORDER — TIZANIDINE HCL 4 MG PO TABS: 4.0000 mg | ORAL_TABLET | Freq: Four times a day (QID) | ORAL | 0 refills | Status: DC | PRN

## 2021-06-23 MED ORDER — PREDNISONE 10 MG PO TABS: ORAL_TABLET | ORAL | 0 refills | Status: DC

## 2021-06-23 NOTE — Telephone Encounter (Signed)
Called patient reviewed all information and repeated back to me. Will call if any questions.  ?With the interpretor Otho Darner # [phone] ?

## 2021-06-23 NOTE — Assessment & Plan Note (Addendum)
-   DG Elbow Complete Right ?- predniSONE (DELTASONE) 10 MG tablet; Take 4 tabs for 2 days, then 3 tabs for 2 days, then 2 tabs for 2 days, then 1 tab for 2 days, then stop  Dispense: 20 tablet; Refill: 0 ?- tiZANidine (ZANAFLEX) 4 MG tablet; Take 1 tablet (4 mg total) by mouth every 6 (six) hours as needed for muscle spasms.  Dispense: 30 tablet; Refill: 0 ? ?2. Chronic right shoulder pain ? ?- DG Shoulder Right ?- predniSONE (DELTASONE) 10 MG tablet; Take 4 tabs for 2 days, then 3 tabs for 2 days, then 2 tabs for 2 days, then 1 tab for 2 days, then stop  Dispense: 20 tablet; Refill: 0 ?- tiZANidine (ZANAFLEX) 4 MG tablet; Take 1 tablet (4 mg total) by mouth every 6 (six) hours as needed for muscle spasms.  Dispense: 30 tablet; Refill: 0 ? ?3. Chronic left hip pain ? ?- DG Hip Unilat W OR W/O Pelvis 2-3 Views Left ?- predniSONE (DELTASONE) 10 MG tablet; Take 4 tabs for 2 days, then 3 tabs for 2 days, then 2 tabs for 2 days, then 1 tab for 2 days, then stop  Dispense: 20 tablet; Refill: 0 ?- tiZANidine (ZANAFLEX) 4 MG tablet; Take 1 tablet (4 mg total) by mouth every 6 (six) hours as needed for muscle spasms.  Dispense: 30 tablet; Refill: 0 ? ? ?4. Skin lesion of hand ? ?- Ambulatory referral to Dermatology ? ? ?Follow up: ? ?Follow up 2-4 weeks with PCP ? ?

## 2021-06-23 NOTE — Patient Instructions (Addendum)
1. Right elbow pain ? ?- DG Elbow Complete Right - unable to complete ?- predniSONE (DELTASONE) 10 MG tablet; Take 4 tabs for 2 days, then 3 tabs for 2 days, then 2 tabs for 2 days, then 1 tab for 2 days, then stop  Dispense: 20 tablet; Refill: 0 ?- tiZANidine (ZANAFLEX) 4 MG tablet; Take 1 tablet (4 mg total) by mouth every 6 (six) hours as needed for muscle spasms.  Dispense: 30 tablet; Refill: 0 ? ?2. Chronic right shoulder pain ? ?- DG Shoulder Right ?- predniSONE (DELTASONE) 10 MG tablet; Take 4 tabs for 2 days, then 3 tabs for 2 days, then 2 tabs for 2 days, then 1 tab for 2 days, then stop  Dispense: 20 tablet; Refill: 0 ?- tiZANidine (ZANAFLEX) 4 MG tablet; Take 1 tablet (4 mg total) by mouth every 6 (six) hours as needed for muscle spasms.  Dispense: 30 tablet; Refill: 0 ? ?3. Chronic left hip pain ? ?- DG Hip Unilat W OR W/O Pelvis 2-3 Views Left ?- predniSONE (DELTASONE) 10 MG tablet; Take 4 tabs for 2 days, then 3 tabs for 2 days, then 2 tabs for 2 days, then 1 tab for 2 days, then stop  Dispense: 20 tablet; Refill: 0 ?- tiZANidine (ZANAFLEX) 4 MG tablet; Take 1 tablet (4 mg total) by mouth every 6 (six) hours as needed for muscle spasms.  Dispense: 30 tablet; Refill: 0 ? ? ?4. Skin lesion of hand ? ?- Ambulatory referral to Dermatology ? ? ?Follow up: ? ?Follow up 2-4 weeks with PCP ? ? ? ?

## 2021-06-23 NOTE — Telephone Encounter (Signed)
Left message to return call to our office.  ?With help from [facility] # [phone] ?

## 2021-06-23 NOTE — Addendum Note (Signed)
Addended by: Ivonne Andrew on: 06/23/2021 04:12 PM ? ? Modules accepted: Orders ? ?

## 2021-06-23 NOTE — Progress Notes (Addendum)
@Patient  ID: , female    DOB: 1990-08-29, 31 y.o.   MRN: [id number] ? ?Chief Complaint  ?Patient presents with  ? Hip Pain  ? ? ?Referring provider: ?[id number], NP ? ?HPI ? ?Patient presents today for joint pain.  She states that she felt last July.  She thinks that her joint pain may be coming as a result of this fall.  However her pain did not actually start until November 2022.  She states that she has been having left hip pain, right shoulder pain, right elbow pain.  Patient denies any decreased range of motion to joints.  She denies any joint instability.  Patient also complains today of a lesion to her right hand that is discolored and itching.  She is requesting to see a dermatologist for this issue.  We discussed that we can refer her to dermatology for this. Denies f/c/s, n/v/d, hemoptysis, PND, chest pain or edema. ? ? ?Note: X-ray tech was unable to complete right elbow x-ray.  Left hip x-ray and right shoulder x-ray were completed.  Patient was advised that she can return on another day to complete the right elbow x-ray if needed. ? ? ? ? ? ? ?No Known Allergies ? ?Immunization History  ?Administered Date(s) Administered  ? Tdap 09/17/2017  ? ? ?Past Medical History:  ?Diagnosis Date  ? No pertinent past medical history   ? ? ?Tobacco History: ?Social History  ? ?Tobacco Use  ?Smoking Status Never  ?Smokeless Tobacco Never  ? ?Counseling given: Yes ? ? ?Outpatient Encounter Medications as of 06/23/2021  ?Medication Sig  ? predniSONE (DELTASONE) 10 MG tablet Take 4 tabs for 2 days, then 3 tabs for 2 days, then 2 tabs for 2 days, then 1 tab for 2 days, then stop  ? tiZANidine (ZANAFLEX) 4 MG tablet Take 1 tablet (4 mg total) by mouth every 6 (six) hours as needed for muscle spasms.  ? Multiple Vitamin (MULTIVITAMIN WITH MINERALS) TABS tablet Take 1 tablet by mouth daily.  ? pantoprazole (PROTONIX) 20 MG tablet Take 1 tablet (20 mg total) by mouth 2 (two) times daily for 14 days.  ?  triamcinolone cream (KENALOG) 0.1 % Apply 1 application topically 2 (two) times daily. (Patient not taking: Reported on 12/12/2020)  ? [DISCONTINUED] predniSONE (DELTASONE) 10 MG tablet Take 6 tabs daily with breakfast x 2 days, 5 tabs daily x 2 days, 4 tabs daily x 2 days, etc (Patient not taking: Reported on 12/12/2020)  ? ?No facility-administered encounter medications on file as of 06/23/2021.  ? ? ? ?Review of Systems ? ?Review of Systems  ?Constitutional: Negative.   ?HENT: Negative.    ?Cardiovascular: Negative.   ?Gastrointestinal: Negative.   ?Musculoskeletal:  Positive for arthralgias.  ?     Chronic right shoulder pain, right elbow pain, and left hip pain since last November.  ?Allergic/Immunologic: Negative.   ?Neurological: Negative.   ?Psychiatric/Behavioral: Negative.     ? ? ? ?Physical Exam ? ?BP 121/73   Pulse 77   Resp 18   SpO2 98%  ? ?Wt Readings from Last 5 Encounters:  ?08/06/20 159 lb 3.2 oz (72.2 kg)  ? ? ? ?Physical Exam ?Vitals and nursing note reviewed.  ?Constitutional:   ?   General: She is not in acute distress. ?   Appearance: She is well-developed.  ?Cardiovascular:  ?   Rate and Rhythm: Normal rate and regular rhythm.  ?Pulmonary:  ?   Effort: Pulmonary effort is  normal.  ?   Breath sounds: Normal breath sounds.  ?Musculoskeletal:  ?   Right shoulder: Tenderness present. Normal range of motion. Normal strength.  ?   Right elbow: No swelling, deformity or effusion. Normal range of motion. Tenderness present.  ?   Left hip: Tenderness present. No deformity or lacerations. Normal range of motion. Normal strength.  ?Neurological:  ?   Mental Status: She is alert and oriented to person, place, and time.  ? ? ? ?Lab Results: ? ?CBC ?   ?Component Value Date/Time  ? WBC 5.9 12/11/2020 2003  ? RBC 4.69 12/11/2020 2003  ? HGB 14.7 12/11/2020 2003  ? HCT 41.7 12/11/2020 2003  ? PLT 237 12/11/2020 2003  ? MCV 88.9 12/11/2020 2003  ? MCH 31.3 12/11/2020 2003  ? MCHC 35.3 12/11/2020 2003  ? RDW  13.4 12/11/2020 2003  ? LYMPHSABS 1.8 12/11/2020 2003  ? MONOABS 0.4 12/11/2020 2003  ? EOSABS 0.1 12/11/2020 2003  ? BASOSABS 0.0 12/11/2020 2003  ? ? ?BMET ?   ?Component Value Date/Time  ? NA 135 12/11/2020 2003  ? K 3.8 12/11/2020 2003  ? CL 105 12/11/2020 2003  ? CO2 24 12/11/2020 2003  ? GLUCOSE 100 (H) 12/11/2020 2003  ? BUN 16 12/11/2020 2003  ? CREATININE 0.54 12/11/2020 2003  ? CALCIUM 8.8 (L) 12/11/2020 2003  ? GFRNONAA >60 12/11/2020 2003  ? ? ?BNP ?No results found for: BNP ? ?ProBNP ?No results found for: PROBNP ? ?Imaging: ?DG Shoulder Right ? ?Result Date: 06/23/2021 ?CLINICAL DATA:  Chronic right shoulder pain. EXAM: RIGHT SHOULDER - 2+ VIEW COMPARISON:  None. FINDINGS: The glenohumeral and acromioclavicular joint spaces are appropriately aligned and maintained. No acute fracture or dislocation. The visualized portion of the right lung is unremarkable. IMPRESSION: Normal right shoulder radiographs. Electronically Signed   By: Neita Garnet M.D.   On: 06/23/2021 15:20  ? ?DG Elbow Complete Right ? ?Result Date: 06/23/2021 ?CLINICAL DATA:  Chronic right elbow pain. EXAM: RIGHT ELBOW - COMPLETE 3+ VIEW COMPARISON:  None. FINDINGS: No joint effusion. Normal bone mineralization. Joint spaces are preserved. No acute fracture or dislocation. IMPRESSION: Normal right elbow radiographs. Electronically Signed   By: Neita Garnet M.D.   On: 06/23/2021 15:21  ? ?DG Hip Unilat W OR W/O Pelvis 2-3 Views Left ? ?Result Date: 06/23/2021 ?CLINICAL DATA:  Chronic left hip pain. EXAM: DG HIP (WITH OR WITHOUT PELVIS) 2-3V LEFT COMPARISON:  CT abdomen and pelvis 12/12/2020 FINDINGS: The bilateral sacroiliac and bilateral femoroacetabular joint spaces are maintained. There is again severe pubic symphysis joint space narrowing with high-grade subchondral sclerosis and subchondral degenerative cystic change including a subchondral cyst versus erosion measuring up to approximately 17 mm in craniocaudal dimension and 12 mm in  transverse dimension within the superior right pubic body. There is minimal superior right and inferior left pubic body offset, unchanged. No acute fracture or dislocation. An IUD overlies the midline pelvis similar to prior CT. 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 Subchondral degenerative cysts versus erosion within the superior right pubic body unchanged. Electronically Signed   By: Neita Garnet M.D.   On: 06/23/2021 15:23   ? ? ?Assessment & Plan:  ? ?Right elbow pain ?- DG Elbow Complete Right ?- predniSONE (DELTASONE) 10 MG tablet; Take 4 tabs for 2 days, then 3 tabs for 2 days, then 2 tabs for 2 days, then 1 tab for 2 days, then stop  Dispense: 20 tablet; Refill: 0 ?-  tiZANidine (ZANAFLEX) 4 MG tablet; Take 1 tablet (4 mg total) by mouth every 6 (six) hours as needed for muscle spasms.  Dispense: 30 tablet; Refill: 0 ? ?2. Chronic right shoulder pain ? ?- DG Shoulder Right ?- predniSONE (DELTASONE) 10 MG tablet; Take 4 tabs for 2 days, then 3 tabs for 2 days, then 2 tabs for 2 days, then 1 tab for 2 days, then stop  Dispense: 20 tablet; Refill: 0 ?- tiZANidine (ZANAFLEX) 4 MG tablet; Take 1 tablet (4 mg total) by mouth every 6 (six) hours as needed for muscle spasms.  Dispense: 30 tablet; Refill: 0 ? ?3. Chronic left hip pain ? ?- DG Hip Unilat W OR W/O Pelvis 2-3 Views Left ?- predniSONE (DELTASONE) 10 MG tablet; Take 4 tabs for 2 days, then 3 tabs for 2 days, then 2 tabs for 2 days, then 1 tab for 2 days, then stop  Dispense: 20 tablet; Refill: 0 ?- tiZANidine (ZANAFLEX) 4 MG tablet; Take 1 tablet (4 mg total) by mouth every 6 (six) hours as needed for muscle spasms.  Dispense: 30 tablet; Refill: 0 ? ? ?4. Skin lesion of hand ? ?- Ambulatory referral to Dermatology ? ? ?Follow up: ? ?Follow up 2-4 weeks with PCP ? ? ? ? ?Ivonne Andrew, NP ?06/23/2021 ? ?

## 2021-06-30 ENCOUNTER — Other Ambulatory Visit: Payer: Self-pay | Admitting: Nurse Practitioner

## 2021-06-30 ENCOUNTER — Encounter: Payer: Self-pay | Admitting: Family

## 2021-06-30 DIAGNOSIS — G8929 Other chronic pain: Secondary | ICD-10-CM

## 2021-06-30 NOTE — Telephone Encounter (Signed)
Referral placed  Thanks!

## 2021-07-01 ENCOUNTER — Telehealth: Payer: Self-pay | Admitting: Family

## 2021-07-01 ENCOUNTER — Ambulatory Visit: Payer: Medicaid Other | Admitting: Orthopaedic Surgery

## 2021-07-01 NOTE — Telephone Encounter (Signed)
Copied from CRM [id number]. Topic: Referral - Status ?>> Jul 01, 2021 11:59 AM Marylen Ponto wrote: ?Reason for CRM: Heather with [facility] reports that they are not in network with the patient's insurance. Cb# [phone] ?

## 2021-07-07 ENCOUNTER — Ambulatory Visit (INDEPENDENT_AMBULATORY_CARE_PROVIDER_SITE_OTHER): Payer: Medicaid Other | Admitting: Orthopaedic Surgery

## 2021-07-07 ENCOUNTER — Other Ambulatory Visit: Payer: Self-pay

## 2021-07-07 ENCOUNTER — Encounter: Payer: Self-pay | Admitting: Orthopaedic Surgery

## 2021-07-07 DIAGNOSIS — M25552 Pain in left hip: Secondary | ICD-10-CM

## 2021-07-07 DIAGNOSIS — M7711 Lateral epicondylitis, right elbow: Secondary | ICD-10-CM | POA: Diagnosis not present

## 2021-07-07 MED ORDER — DICLOFENAC SODIUM 75 MG PO TBEC: 75.0000 mg | DELAYED_RELEASE_TABLET | Freq: Two times a day (BID) | ORAL | 2 refills | Status: DC

## 2021-07-07 NOTE — Progress Notes (Signed)
? ?Office Visit Note ?  ?Patient: Kristen Rice           ?Date of Birth: June 09, 1990           ?MRN: [id number] ?Visit Date: 07/07/2021 ?             ?Requested by: Ivonne Andrew, NP ?[phone] ?[place],  [place] [address] ?PCP: Rema Fendt, NP ? ? ?Assessment & Plan: ?Visit Diagnoses:  ?1. Pain in left hip   ?2. Right tennis elbow   ? ? 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  In regards to the hips, we have discussed cortisone injection and physical therapy.  She is not interested in either.  In regards to the elbow, we have discussed providing her with a counterforce brace in addition to ECRB stretches.  She is agreeable to this plan.  She will follow-up with [place] as needed. ? ?Follow-Up Instructions: Return if symptoms worsen or fail to improve.  ? ?Orders:  ?No orders of the defined types were placed in this encounter. ? ?Meds ordered this encounter  ?Medications  ? diclofenac (VOLTAREN) 75 MG EC tablet  ?  Sig: Take 1 tablet (75 mg total) by mouth 2 (two) times daily.  ?  Dispense:  60 tablet  ?  Refill:  2  ? ? ? ? Procedures: ?No procedures performed ? ? ?Clinical Data: ?No additional findings. ? ? ?Subjective: ?Chief Complaint  ?Patient presents with  ? Left Hip - Pain  ? ? ?HPI patient is a 31 year old Spanish-speaking female who is here today with interpreter.  She is here with left hip pain and right elbow pain.  In regards to her left hip, she has had pain for the past year.  The majority is to the lateral aspect but she does note occasional pain radiating into the groin.  She describes this as constant without any specific aggravators.  She does not take medication for this.  She does note occasional numbness and tingling to the lateral hip.  She she has recent x-rays of the pelvis which showed severe pubic symphysis osteoarthritis  with subchondral degenerative cyst versus erosion.  No fevers or chills or any other constitutional symptoms.  She does note that she has given birth to 4 children.  In regards to her right elbow, she notes that she fell landing on the lateral aspect about 3 years ago.  She has had intermittent pain since.  The pain is to the lateral elbow worse with certain activities.  She has been using heat with mild relief. ? ?Review of Systems as detailed in HPI.  All others reviewed and are negative. ? ? ?Objective: ?Vital Signs: There were no vitals taken for this visit. ? ?Physical Exam well-developed well-nourished female no acute distress.  Alert and oriented x3. ? ?Ortho Exam hip exam shows marked tenderness to the greater trochanter.  She has limitation and pain with external rotation of the hip.  Positive logroll.  Negative straight leg raise.  She is neurovascular intact distally.  Right elbow exam shows moderate tenderness to the lateral epicondyle.  She has increased pain with gripping as well as with resisted long finger extension.  No focal weakness.  She is neurovascular intact distally. ? ?Specialty Comments:  ?No specialty comments available. ? ?Imaging: ?No new imaging ? ? ?PMFS History: ?Patient Active Problem List  ?  Diagnosis Date Noted  ? Right elbow pain 06/23/2021  ? ?Past Medical History:  ?Diagnosis Date  ? No pertinent past medical history   ?  ?Family History  ?Family history unknown: Yes  ?  ?Past Surgical History:  ?Procedure Laterality Date  ? HERNIA REPAIR    ? ?Social History  ? ?Occupational History  ? Not on file  ?Tobacco Use  ? Smoking status: Never  ? Smokeless tobacco: Never  ?Vaping Use  ? Vaping Use: Never used  ?Substance and Sexual Activity  ? Alcohol use: Not Currently  ? Drug use: Never  ? Sexual activity: Yes  ?  Birth control/protection: I.U.D.  ? ? ? ? ? ? ?

## 2021-07-08 ENCOUNTER — Encounter: Payer: Self-pay | Admitting: Family

## 2021-07-17 NOTE — Progress Notes (Signed)
Erroneous encounter

## 2021-07-21 ENCOUNTER — Encounter: Payer: Medicaid Other | Admitting: Family

## 2021-10-02 ENCOUNTER — Emergency Department (HOSPITAL_BASED_OUTPATIENT_CLINIC_OR_DEPARTMENT_OTHER)
Admission: EM | Admit: 2021-10-02 | Discharge: 2021-10-02 | Disposition: A | Discharge status: Home / Self Care | Payer: Medicaid Other | Attending: Emergency Medicine | Admitting: Emergency Medicine

## 2021-10-02 ENCOUNTER — Emergency Department (HOSPITAL_BASED_OUTPATIENT_CLINIC_OR_DEPARTMENT_OTHER): Payer: Medicaid Other | Admitting: Radiology

## 2021-10-02 ENCOUNTER — Encounter (HOSPITAL_BASED_OUTPATIENT_CLINIC_OR_DEPARTMENT_OTHER): Payer: Self-pay

## 2021-10-02 ENCOUNTER — Other Ambulatory Visit: Payer: Self-pay

## 2021-10-02 DIAGNOSIS — M79641 Pain in right hand: Secondary | ICD-10-CM | POA: Diagnosis not present

## 2021-10-02 DIAGNOSIS — M25521 Pain in right elbow: Secondary | ICD-10-CM | POA: Insufficient documentation

## 2021-10-02 DIAGNOSIS — M25531 Pain in right wrist: Secondary | ICD-10-CM | POA: Diagnosis not present

## 2021-10-02 DIAGNOSIS — M25511 Pain in right shoulder: Secondary | ICD-10-CM | POA: Insufficient documentation

## 2021-10-02 DIAGNOSIS — G8929 Other chronic pain: Secondary | ICD-10-CM | POA: Insufficient documentation

## 2021-10-02 MED ORDER — NAPROXEN 500 MG PO TABS: 500.0000 mg | ORAL_TABLET | Freq: Two times a day (BID) | ORAL | 0 refills | Status: AC

## 2021-10-02 MED ORDER — METHOCARBAMOL 500 MG PO TABS: 500.0000 mg | ORAL_TABLET | Freq: Every evening | ORAL | 0 refills | Status: AC

## 2021-10-02 NOTE — Discharge Instructions (Addendum)
You have been provided the contact information for local orthopedic specialist by the name of Dr. Victorino Dike.  Please call and schedule an appointment within the next few days for reevaluation and continued medical management.  You have also been provided 2 prescriptions, they are as follows: Naproxen-an anti-inflammatory.  You may take 1 tablet every 12 hours as needed for pain.  Do not take ibuprofen or Motrin at the same time, as these are in the same medication class.  Always take with plenty of food and water.  You may though take Tylenol at the same time as the naproxen. Robaxin-a muscle relaxant.  You may take 1 tablet before you go to bed for additional relief.  Do not drive or operate machinery within 6 to 8 hours, or right after taking this medication.  This has a side effect of drowsiness.  If you feel this is more than you can tolerate, you can stop taking this medication at any time.  Return to the ED for new or worsening symptoms as discussed.

## 2021-10-02 NOTE — ED Provider Notes (Signed)
[facility] EMERGENCY DEPT Provider Note   CSN: [id number] Arrival date & time: 10/02/21  1259     History  Chief Complaint  Patient presents with   Joint Pain    Kristen Rice is a 31 y.o. female with chief complaint of acute on chronic right shoulder and right elbow pain over the last 9-12 months.  Seen by PCP in March for same issue.  Described as intermittent.  States she fell 1 year ago, landing on her right hip and leg without notable injury to the right upper extremity.  Started having pain in the right upper extremity 3 months later.  Pain described as strong, and waxing and waning.  Denies numbness, recent fevers, tingling, weakness, or any other complaints at this time.  States range of motion is normal and does not appear to be affected.  Usually worse at night.  The history is provided by the patient and medical records.     Home Medications Prior to Admission medications   Medication Sig Start Date End Date Taking? Authorizing Provider  methocarbamol (ROBAXIN) 500 MG tablet Take 1 tablet (500 mg total) by mouth at bedtime for 7 days. 10/02/21 10/09/21 Yes Cecil Cobbs, PA-C  naproxen (NAPROSYN) 500 MG tablet Take 1 tablet (500 mg total) by mouth 2 (two) times daily. 10/02/21  Yes Cecil Cobbs, PA-C  Multiple Vitamin (MULTIVITAMIN WITH MINERALS) TABS tablet Take 1 tablet by mouth daily.    [provider]  pantoprazole (PROTONIX) 20 MG tablet Take 1 tablet (20 mg total) by mouth 2 (two) times daily for 14 days. 12/12/20 12/26/20  Mesner, Barbara Cower, MD  predniSONE (DELTASONE) 10 MG tablet Take 4 tabs for 2 days, then 3 tabs for 2 days, then 2 tabs for 2 days, then 1 tab for 2 days, then stop 06/23/21   Ivonne Andrew, NP  tiZANidine (ZANAFLEX) 4 MG tablet Take 1 tablet (4 mg total) by mouth every 6 (six) hours as needed for muscle spasms. 06/23/21   Ivonne Andrew, NP  triamcinolone cream (KENALOG) 0.1 % Apply 1 application topically 2 (two) times  daily. 11/19/20   Particia Nearing, PA-C      Allergies    Patient has no known allergies.    Review of Systems   Review of Systems  Musculoskeletal:        Pain of right shoulder, elbow, wrist   Physical Exam Updated Vital Signs BP 114/74   Pulse 80   Temp 98.2 F (36.8 C) (Oral)   Resp 16   Ht 5' 4.33" (1.634 m)   Wt 72.2 kg   SpO2 98%   BMI 27.04 kg/m  Physical Exam Vitals and nursing note reviewed.  Constitutional:      General: She is not in acute distress.    Appearance: Normal appearance. She is well-developed. She is not ill-appearing or diaphoretic.  HENT:     Head: Normocephalic and atraumatic.  Eyes:     Conjunctiva/sclera: Conjunctivae normal.  Neck:     Comments: Neck very supple on exam Cardiovascular:     Rate and Rhythm: Normal rate and regular rhythm.     Heart sounds: No murmur heard. Pulmonary:     Effort: Pulmonary effort is normal. No respiratory distress.     Breath sounds: Normal breath sounds.  Abdominal:     Palpations: Abdomen is soft.     Tenderness: There is no abdominal tenderness.  Musculoskeletal:        General:  No swelling or tenderness.     Cervical back: Neck supple. No rigidity or tenderness.     Right lower leg: No edema.     Left lower leg: No edema.     Comments: No appreciated significant tenderness or reduced ROM on exam.  No tenderness elicited with ROM.  No appreciated crepitus, obvious deformity, malalignment, ecchymosis, rash, erythema, edema, swelling.  Radial pulses 2+ bilaterally.  CRT less than 2 seconds.  Sensation appears intact.  Skin:    General: Skin is warm and dry.     Capillary Refill: Capillary refill takes less than 2 seconds.     Coloration: Skin is not pale.     Findings: No bruising or rash.  Neurological:     Mental Status: She is alert and oriented to person, place, and time.     Sensory: No sensory deficit.     Motor: No weakness.  Psychiatric:        Mood and Affect: Mood normal.    ED  Results / Procedures / Treatments   Labs (all labs ordered are listed, but only abnormal results are displayed) Labs Reviewed - No data to display  EKG None  Radiology DG Elbow Complete Right  Result Date: 10/02/2021 CLINICAL DATA:  Right wrist and elbow pain EXAM: RIGHT ELBOW - COMPLETE 3+ VIEW COMPARISON:  None Available. FINDINGS: No acute fracture or dislocation. No aggressive osseous lesion. Normal alignment. Soft tissue are unremarkable. No radiopaque foreign body or soft tissue emphysema. IMPRESSION: 1.  No acute osseous injury of the right elbow. Electronically Signed   By: Elige Ko M.D.   On: 10/02/2021 13:50   DG Shoulder Right  Result Date: 10/02/2021 CLINICAL DATA:  Pain without trauma EXAM: RIGHT SHOULDER - 2+ VIEW COMPARISON:  06/23/2021 FINDINGS: No acute fracture or dislocation. Joint spaces maintained. Visualized portion of the right hemithorax is normal. IMPRESSION: No acute osseous abnormality. Electronically Signed   By: Jeronimo Greaves M.D.   On: 10/02/2021 13:49   DG Wrist Complete Right  Result Date: 10/02/2021 CLINICAL DATA:  Pain for 8 months EXAM: RIGHT WRIST - COMPLETE 3+ VIEW COMPARISON:  None Available. FINDINGS: No distal radius or ulnar fracture. Radiocarpal joint is intact. No carpal fracture. No soft tissue abnormality. IMPRESSION: No acute or chronic osseous abnormality. Electronically Signed   By: Genevive Bi M.D.   On: 10/02/2021 13:48    Procedures Procedures    Medications Ordered in ED Medications - No data to display  ED Course/ Medical Decision Making/ A&P                           Medical Decision Making Amount and/or Complexity of Data Reviewed External Data Reviewed: notes. Labs: ordered. Decision-making details documented in ED Course. Radiology: ordered and independent interpretation performed. Decision-making details documented in ED Course. ECG/medicine tests: ordered and independent interpretation performed. Decision-making  details documented in ED Course.  Risk OTC drugs. Prescription drug management.   31 y.o. female presents to the ED for concern of Joint Pain   This involves an extensive number of treatment options, and is a complaint that carries with it a high risk of complications and morbidity.  The emergent differential diagnosis prior to evaluation includes, but is not limited to: Fracture, dislocation, contusion, impingement  This is not an exhaustive differential.   Past Medical History / Co-morbidities / Social History: Hx prior surgical hernia repair.  Additional History:  Internal and external  records from outside source obtained and reviewed including Family medicine  Physical Exam: Physical exam performed. The pertinent findings include: Overall unremarkable.  Lab Tests: None  Imaging Studies: I ordered imaging studies including x-ray imaging of right shoulder, elbow, and wrist .  I independently visualized and interpreted said imaging.  Pertinent results include: No acute bony pathology, fracture, dislocation. I agree with the radiologist interpretation.  Medications: I have reviewed the patients home medicines and have made adjustments as needed  ED Course/Disposition: Pt well-appearing on exam.  Presenting with chronic right upper extremity pain.  Suspicious of possible radiculopathy type symptoms.  Patient X-Rays negative for obvious pathology, fracture, or dislocation.  Upper extremity without significant exam findings, appears neurovascularly intact.  Suspicious of benign etiology such as cubital tunnel syndrome VS carpal tunnel syndrome VS cervical radiculopathy.  Low suspicion for CVA, ACS, cellulitis, compartment syndrome, DVT, or ischemia.  Offered pain management or bracing in the ED, for which the patient declined.  Pt advised to follow up with orthopedics for further evaluation.  Conservative therapy recommended and discussed.  Patient in NAD in good condition at time of  discharge.  After consideration of the diagnostic results and the patient's encounter today, I feel that the emergency department workup does not suggest an emergent condition requiring admission or immediate intervention beyond what has been performed at this time.  The patient is safe for discharge and has been instructed to return immediately for worsening symptoms, change in symptoms or any other concerns.  Discussed course of treatment thoroughly with the patient, whom demonstrated understanding.  Patient in agreement and has no further questions.  I discussed this case with my attending physician Dr. Rosalia Hammers, who agreed with the proposed treatment course and cosigned this note including patient's presenting symptoms, physical exam, and planned diagnostics and interventions.  Attending physician stated agreement with plan or made changes to plan which were implemented.     This chart was dictated using voice recognition software.  Despite best efforts to proofread, errors can occur which can change the documentation meaning.         Final Clinical Impression(s) / ED Diagnoses Final diagnoses:  Chronic right shoulder pain  Chronic elbow pain, right  Chronic hand pain, right    Rx / DC Orders ED Discharge Orders          Ordered    methocarbamol (ROBAXIN) 500 MG tablet  Nightly        10/02/21 1501    naproxen (NAPROSYN) 500 MG tablet  2 times daily        10/02/21 1501              Cecil Cobbs, Cordelia Poche 10/02/21 2318    Margarita Grizzle, MD 10/05/21 1217

## 2021-10-02 NOTE — ED Triage Notes (Signed)
Patient here POV from Home.  Endorses Pain to Right Shoulder and Right Elbow for 8-9 Months. Seen by PCP in March for Same and was told she would be sent for Studies but has been.   Pain is Intermittent in [place] and recalls No recent or Specific Trauma.   NAD Noted during Triage. A&Ox4. GCS 15. Ambulatory.

## 2021-11-15 DIAGNOSIS — B349 Viral infection, unspecified: Secondary | ICD-10-CM | POA: Diagnosis not present

## 2021-11-15 DIAGNOSIS — R051 Acute cough: Secondary | ICD-10-CM | POA: Diagnosis not present

## 2021-11-15 DIAGNOSIS — J029 Acute pharyngitis, unspecified: Secondary | ICD-10-CM | POA: Diagnosis not present

## 2021-11-15 DIAGNOSIS — Z20822 Contact with and (suspected) exposure to covid-19: Secondary | ICD-10-CM | POA: Diagnosis not present

## 2021-12-18 NOTE — Progress Notes (Deleted)
Virtual Visit via Telephone Note  I connected with Kristen Rice, on 12/18/2021 at 8:41 AM by telephone and verified that I am speaking with the correct person using two identifiers.  Consent: I discussed the limitations, risks, security and privacy concerns of performing an evaluation and management service by telephone and the availability of in person appointments. I also discussed with the patient that there may be a patient responsible charge related to this service. The patient expressed understanding and agreed to proceed.   Location of Patient: Home  Location of Provider: [facility] at [facility]   Persons participating in Telemedicine visit: Anairis Vega Rice Ricky Stabs, NP Margorie John, CMA   History of Present Illness: Kristen Rice is a 31 year-old female who presents for medication refills. Unsure which meds?...  gyno referral request     Past Medical History:  Diagnosis Date   No pertinent past medical history    No Known Allergies  Current Outpatient Medications on File Prior to Visit  Medication Sig Dispense Refill   Multiple Vitamin (MULTIVITAMIN WITH MINERALS) TABS tablet Take 1 tablet by mouth daily.     naproxen (NAPROSYN) 500 MG tablet Take 1 tablet (500 mg total) by mouth 2 (two) times daily. 30 tablet 0   pantoprazole (PROTONIX) 20 MG tablet Take 1 tablet (20 mg total) by mouth 2 (two) times daily for 14 days. 28 tablet 0   predniSONE (DELTASONE) 10 MG tablet Take 4 tabs for 2 days, then 3 tabs for 2 days, then 2 tabs for 2 days, then 1 tab for 2 days, then stop 20 tablet 0   tiZANidine (ZANAFLEX) 4 MG tablet Take 1 tablet (4 mg total) by mouth every 6 (six) hours as needed for muscle spasms. 30 tablet 0   triamcinolone cream (KENALOG) 0.1 % Apply 1 application topically 2 (two) times daily. 90 g 0   No current facility-administered medications on file prior to visit.    Observations/Objective: Alert and oriented x 3. Not in  acute distress. Physical examination not completed as this is a telemedicine visit.  Assessment and Plan: ***  Follow Up Instructions: ***   Patient was given clear instructions to go to Emergency Department or return to medical center if symptoms don't improve, worsen, or new problems develop.The patient verbalized understanding.  I discussed the assessment and treatment plan with the patient. The patient was provided an opportunity to ask questions and all were answered. The patient agreed with the plan and demonstrated an understanding of the instructions.   The patient was advised to call back or seek an in-person evaluation if the symptoms worsen or if the condition fails to improve as anticipated.     I provided *** minutes total of non-face-to-face time during this encounter.   Rema Fendt, NP  [facility] [place], [place] [phone] 12/18/2021, 8:41 AM

## 2021-12-19 NOTE — Progress Notes (Unsigned)
Patient ID: Kristen Rice, female    DOB: 17-May-1990  MRN: [id number]  CC: No chief complaint on file.   Subjective: Kristen Rice is a 31 y.o. female who presents for  Her concerns today include:   Gyn referral   Med refills?  Patient Active Problem List   Diagnosis Date Noted   Right elbow pain 06/23/2021     Current Outpatient Medications on File Prior to Visit  Medication Sig Dispense Refill   Multiple Vitamin (MULTIVITAMIN WITH MINERALS) TABS tablet Take 1 tablet by mouth daily.     naproxen (NAPROSYN) 500 MG tablet Take 1 tablet (500 mg total) by mouth 2 (two) times daily. 30 tablet 0   pantoprazole (PROTONIX) 20 MG tablet Take 1 tablet (20 mg total) by mouth 2 (two) times daily for 14 days. 28 tablet 0   predniSONE (DELTASONE) 10 MG tablet Take 4 tabs for 2 days, then 3 tabs for 2 days, then 2 tabs for 2 days, then 1 tab for 2 days, then stop 20 tablet 0   tiZANidine (ZANAFLEX) 4 MG tablet Take 1 tablet (4 mg total) by mouth every 6 (six) hours as needed for muscle spasms. 30 tablet 0   triamcinolone cream (KENALOG) 0.1 % Apply 1 application topically 2 (two) times daily. 90 g 0   No current facility-administered medications on file prior to visit.    No Known Allergies  Social History   Socioeconomic History   Marital status: Single    Spouse name: Not on file   Number of children: Not on file   Years of education: Not on file   Highest education level: Not on file  Occupational History   Not on file  Tobacco Use   Smoking status: Never   Smokeless tobacco: Never  Vaping Use   Vaping Use: Never used  Substance and Sexual Activity   Alcohol use: Not Currently   Drug use: Never   Sexual activity: Yes    Birth control/protection: I.U.D.  Other Topics Concern   Not on file  Social History Narrative   Not on file   Social Determinants of Health   Financial Resource Strain: Not on file  Food Insecurity: Not on file  Transportation Needs: Not on  file  Physical Activity: Not on file  Stress: Not on file  Social Connections: Not on file  Intimate Partner Violence: Not on file    Family History  Family history unknown: Yes    Past Surgical History:  Procedure Laterality Date   HERNIA REPAIR      ROS: Review of Systems Negative except as stated above  PHYSICAL EXAM: There were no vitals taken for this visit.  Physical Exam  {female adult master:310786} {female adult master:310785}     Latest Ref Rng & Units 12/11/2020    8:03 PM  CMP  Glucose 70 - 99 mg/dL 409   BUN 6 - 20 mg/dL 16   Creatinine 8.11 - 1.00 mg/dL 9.14   Sodium 782 - 956 mmol/L 135   Potassium 3.5 - 5.1 mmol/L 3.8   Chloride 98 - 111 mmol/L 105   CO2 22 - 32 mmol/L 24   Calcium 8.9 - 10.3 mg/dL 8.8   Total Protein 6.5 - 8.1 g/dL 6.9   Total Bilirubin 0.3 - 1.2 mg/dL 0.7   Alkaline Phos 38 - 126 U/L 46   AST 15 - 41 U/L 20   ALT 0 - 44 U/L 16  Lipid Panel  No results found for: "CHOL", "TRIG", "HDL", "CHOLHDL", "VLDL", "LDLCALC", "LDLDIRECT"  CBC    Component Value Date/Time   WBC 5.9 12/11/2020 2003   RBC 4.69 12/11/2020 2003   HGB 14.7 12/11/2020 2003   HCT 41.7 12/11/2020 2003   PLT 237 12/11/2020 2003   MCV 88.9 12/11/2020 2003   MCH 31.3 12/11/2020 2003   MCHC 35.3 12/11/2020 2003   RDW 13.4 12/11/2020 2003   LYMPHSABS 1.8 12/11/2020 2003   MONOABS 0.4 12/11/2020 2003   EOSABS 0.1 12/11/2020 2003   BASOSABS 0.0 12/11/2020 2003    ASSESSMENT AND PLAN:  There are no diagnoses linked to this encounter.   Patient was given the opportunity to ask questions.  Patient verbalized understanding of the plan and was able to repeat key elements of the plan. Patient was given clear instructions to go to Emergency Department or return to medical center if symptoms don't improve, worsen, or new problems develop.The patient verbalized understanding.   No orders of the defined types were placed in this encounter.    Requested  Prescriptions    No prescriptions requested or ordered in this encounter    No follow-ups on file.  Kristen Fendt, NP

## 2021-12-22 ENCOUNTER — Ambulatory Visit (INDEPENDENT_AMBULATORY_CARE_PROVIDER_SITE_OTHER): Payer: Medicaid Other | Admitting: Family

## 2021-12-22 ENCOUNTER — Encounter: Payer: Self-pay | Admitting: Family

## 2021-12-22 ENCOUNTER — Telehealth: Payer: Self-pay | Admitting: Family

## 2021-12-22 VITALS — BP 119/84 | HR 72 | Temp 98.0°F | Resp 16 | Ht 64.0 in | Wt 151.0 lb

## 2021-12-22 DIAGNOSIS — Z975 Presence of (intrauterine) contraceptive device: Secondary | ICD-10-CM | POA: Diagnosis not present

## 2021-12-22 DIAGNOSIS — F419 Anxiety disorder, unspecified: Secondary | ICD-10-CM | POA: Diagnosis not present

## 2021-12-22 DIAGNOSIS — G8929 Other chronic pain: Secondary | ICD-10-CM | POA: Diagnosis not present

## 2021-12-22 DIAGNOSIS — L299 Pruritus, unspecified: Secondary | ICD-10-CM

## 2021-12-22 DIAGNOSIS — F32A Depression, unspecified: Secondary | ICD-10-CM

## 2021-12-22 DIAGNOSIS — M5442 Lumbago with sciatica, left side: Secondary | ICD-10-CM

## 2021-12-22 MED ORDER — MELOXICAM 7.5 MG PO TABS: 7.5000 mg | ORAL_TABLET | Freq: Every day | ORAL | 1 refills | Status: AC

## 2021-12-22 MED ORDER — HYDROXYZINE PAMOATE 25 MG PO CAPS: 25.0000 mg | ORAL_CAPSULE | Freq: Every day | ORAL | 1 refills | Status: AC

## 2021-12-22 NOTE — Patient Instructions (Signed)
Intrauterine Device Insertion An intrauterine device (IUD) is a medical device that is inserted into the uterus to prevent pregnancy. It is a small, T-shaped device that has one or two nylon strings hanging down from it. The strings hang out of the lower part of the uterus (cervix) to allow for future IUD removal. There are two types of IUDs: Hormone IUD. This type of IUD is made of plastic and contains the hormone progestin (synthetic progesterone). A hormone IUD may last 3-5 years, depending on which one you have. Synthetic progesterone prevents pregnancy by: Thickening cervical mucus to prevent sperm from entering the uterus. Thinning the uterine lining to prevent a fertilized egg from implanting there. Copper IUD. This type of IUD has copper wire wrapped around it. A copper IUD may last up to 10 years. Copper prevents pregnancy by making the uterus and fallopian tubes produce a fluid that kills sperm. Tell a health care provider about: Any allergies you have. All medicines you are taking, including vitamins, herbs, eye drops, creams, and over-the-counter medicines. Any surgeries you have had. Any medical conditions you have, including any sexually transmitted infections (STIs) you may have. Whether you are pregnant or may be pregnant. What are the risks? Generally, this is a safe procedure. However, problems may occur, including: Infection. Bleeding. Allergic reactions to medicines. Puncture (perforation) of the uterus or damage to other structures or organs. Accidental placement of the IUD either in the muscle layer of the uterus (myometrium) or outside the uterus. The IUD falling out of the uterus (expulsion). This is more common among women who have recently had a child. Higher risk of an egg being fertilized outside your uterus (ectopic pregnancy).This is rare. Pelvic inflammatory disease (PID), which is an infection in the uterus and fallopian tubes. The IUD does not cause the  infection. The infection is usually from an unknown sexually transmitted infection (STI). This is rare, and it usually happens during the first 20 days after the IUD is inserted. What happens before the procedure? Ask your health care provider about: Changing or stopping your regular medicines. This is especially important if you are taking diabetes medicines or blood thinners. Taking over-the-counter medicines, vitamins, herbs, and supplements. Talk with your health care provider about when to schedule your IUD placement. Your health care provider may recommend taking over-the-counter pain medicines before the procedure. These medicines include ibuprofen and naproxen. You may have tests for: Pregnancy. A pregnancy test involves having a urine or blood sample taken. Sexually transmitted infections (STIs). Placing an IUD in someone who has an STI can make the infection worse. Cervical cancer. You may have a Pap test to check for this type of cancer. This means collecting cells from your cervix to be checked under a microscope. You may have a physical exam to determine the size and position of your uterus. What happens during the procedure? A tool (speculum) will be placed in your vagina and widened so that your health care provider can see your cervix. Medicine, or antiseptic, may be applied to your cervix to help lower your risk of infection. You may be given an anesthetic medicine to numb each side of your cervix. This medicine is usually given by an injection into the cervix. A tool called a uterine sound will be inserted into your uterus to check the length of your uterus and the direction that your uterus may be tilted. A slim instrument or tube (IUD inserter) that holds the IUD will be inserted into your vagina,   through your cervical canal, and into your uterus. The IUD will be placed in the uterus, and the IUD inserter will be removed. The strings that are attached to the IUD will be trimmed  so that they lie just below the cervix. The speculum will be removed. The procedure may vary among health care providers and hospitals. What can I expect after procedure? You may have bleeding after the procedure. This is normal. It varies from light bleeding (spotting) for a few days to menstrual-like bleeding. You may have cramping and pain in the abdomen. You may feel dizzy or light-headed. You may have lower back pain. You may have headaches and nausea. Follow these instructions at home: Before resuming sexual activity, check to make sure that you can feel the IUD string or strings. You should be able to feel the end of the string below the opening of your cervix. If your IUD string is in place, you may resume sexual activity. If you had a hormonal IUD inserted more than 7 days after your most recent period started, you will need to use a backup method of birth control for 7 days after IUD insertion. Ask your health care provider whether this applies to you. Continue to check that the IUD is still in place by feeling for the strings after every menstrual period, or once a month. An IUD will not protect you from sexually transmitted infections (STIs). Use methods to prevent the exchange of body fluids between partners (barrier protection) every time you have sex. Barrier protection can be used during oral, vaginal, or anal sex. Commonly used barrier methods include: Female condom. Female condom. Dental dam. Take over-the-counter and prescription medicines only as told by your health care provider. Keep all follow-up visits. This is important. Contact a health care provider if: You feel light-headed or weak. You have any of the following problems with your IUD string or strings: The string bothers or hurts you or your sexual partner. You cannot feel the string. The string has gotten longer. You can feel the IUD in your vagina. You think you may be pregnant, or you miss your menstrual  period. You think you may have a sexually transmitted infection (STI). Get help right away if you: You have flu-like symptoms, such as tiredness (fatigue) and muscle aches. You have a fever and chills. You have bleeding that is heavier or lasts longer than a normal menstrual cycle. You have abnormal or bad-smelling discharge from your vagina. You develop abdominal pain that is new, is getting worse, or is not in the same area of earlier cramping and pain. You have pain during sexual activity. Summary An intrauterine device (IUD) is a small, T-shaped device that has one or two nylon strings hanging down from it. You may have a copper IUD or a hormone IUD. Ask your health care provider what you need to do before the procedure. You may have some tests and you may have to change or stop some medicines. You may have bleeding after the procedure. This is normal. It varies from light spotting for a few days to menstrual-like bleeding. Check to make sure that you can feel the IUD strings before you resume sexual activity. Check the strings after every menstrual period or once a month. An IUD does not protect against STIs. Use other methods to protect yourself against infections. This information is not intended to replace advice given to you by your health care provider. Make sure you discuss any questions you have with   your health care provider. Document Revised: 10/17/2019 Document Reviewed: 10/17/2019 Elsevier Patient Education  2023 [employer] Inc.  

## 2021-12-22 NOTE — Progress Notes (Signed)
IUD removal.  IUD expired x 42mos.  C/o abdominal cramping.   Depression   Generalized rash on body

## 2021-12-25 ENCOUNTER — Telehealth: Payer: Medicaid Other | Admitting: Family

## 2021-12-29 NOTE — Telephone Encounter (Signed)
LCSWA called patient today to introduce herself and to assess patients' mental health needs. Patient did not answer the phone. LCSWA was able to leave a brief message with the patient asking them to return the call. Patient was referred by PCP for anxiety and depression.   

## 2022-02-04 ENCOUNTER — Ambulatory Visit (HOSPITAL_COMMUNITY): Payer: Medicaid Other | Admitting: Student

## 2023-04-08 IMAGING — CT CT ABD-PELV W/ CM
2 of 4 series · 16 of 46 positions shown, 18 images · IV contrast (Omni 300)
Comparison: None.

CLINICAL DATA: Abdominal abscess/infection suspected. Unspecified
abdominal pain.

EXAM:
CT ABDOMEN AND PELVIS WITH CONTRAST
TECHNIQUE: Multidetector CT imaging of the abdomen and pelvis was performed
using the standard protocol following bolus administration of
intravenous contrast.
CONTRAST:  75mL OMNIPAQUE IOHEXOL 350 MG/ML SOLN

[Series 3: a/p w/ 5mm · axial · 0.90mm/px · z∈[+774,+1244]mm · 13 of 104 slices shown, 15 images]
[im 5/104  soft-tissue]
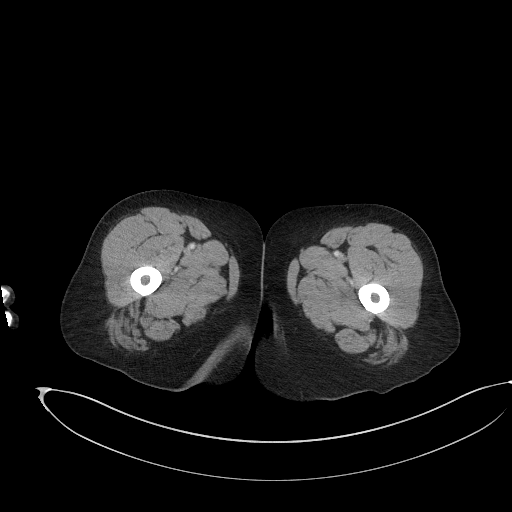
[im 5/104  bone]
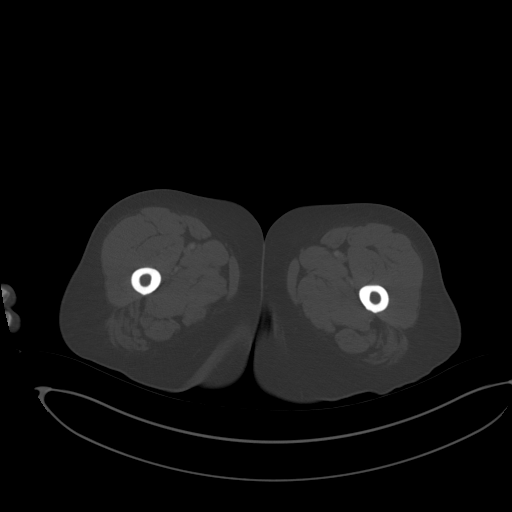
[im 13/104  soft-tissue]
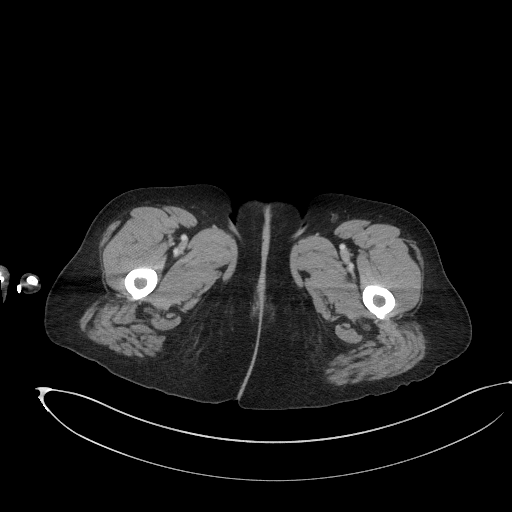
[im 22/104  soft-tissue]
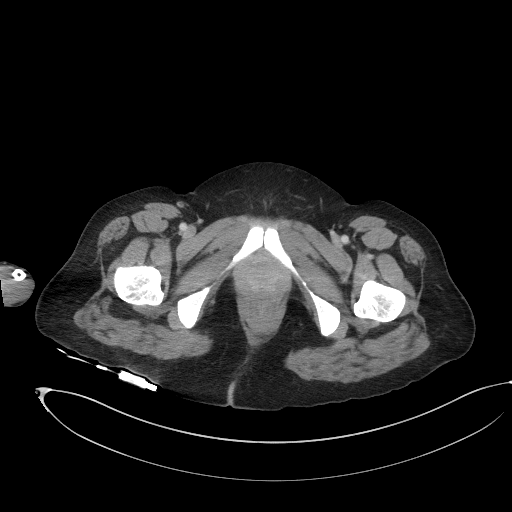
[im 31/104  soft-tissue]
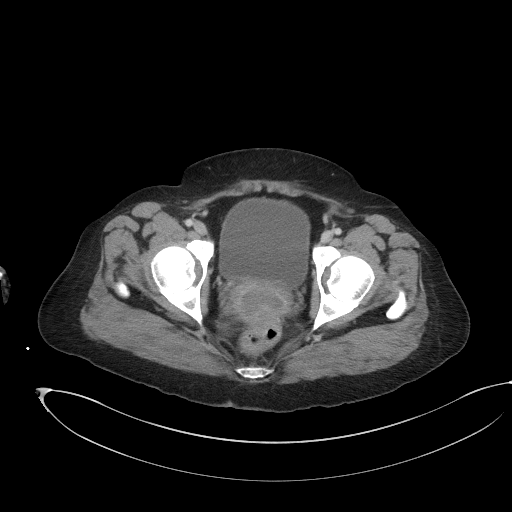
[im 35/104  soft-tissue]
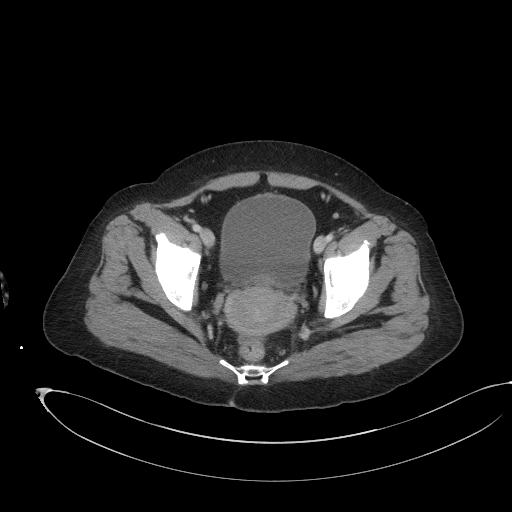
[im 43/104  soft-tissue]
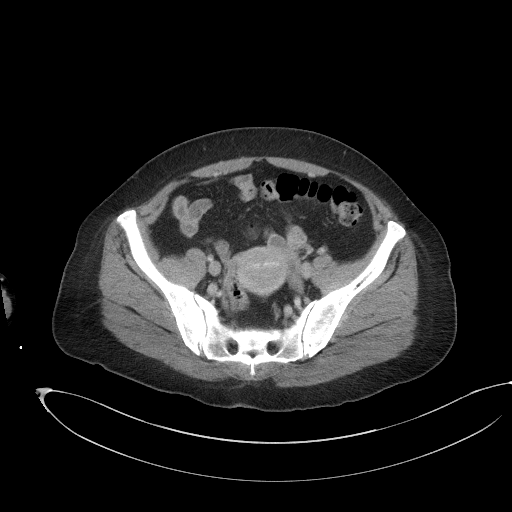
[im 52/104  soft-tissue]
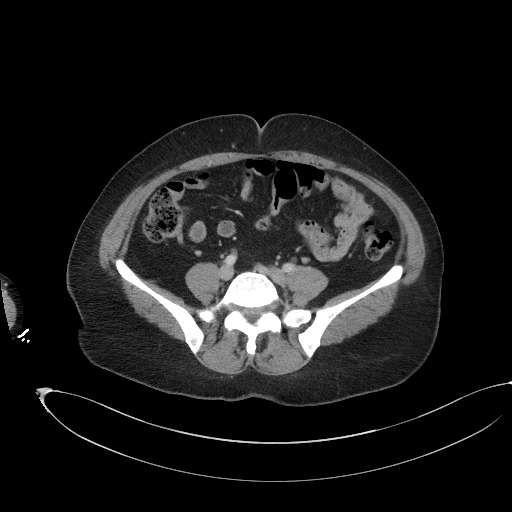
[im 61/104  soft-tissue]
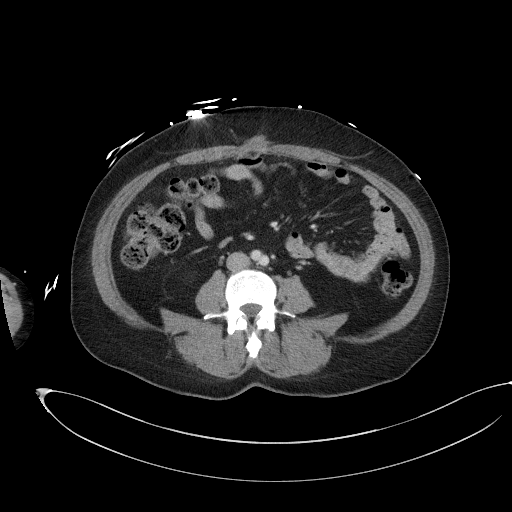
[im 69/104  soft-tissue]
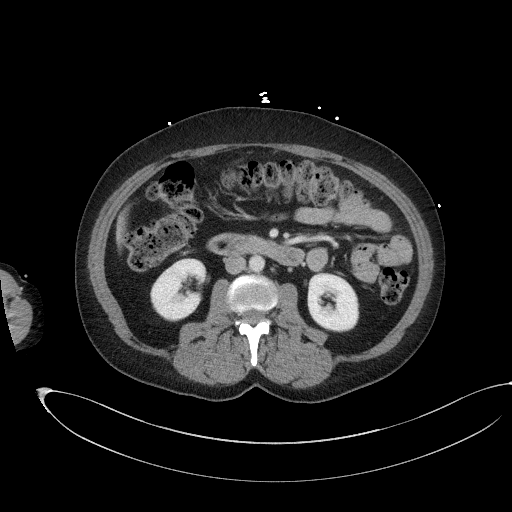
[im 69/104  bone]
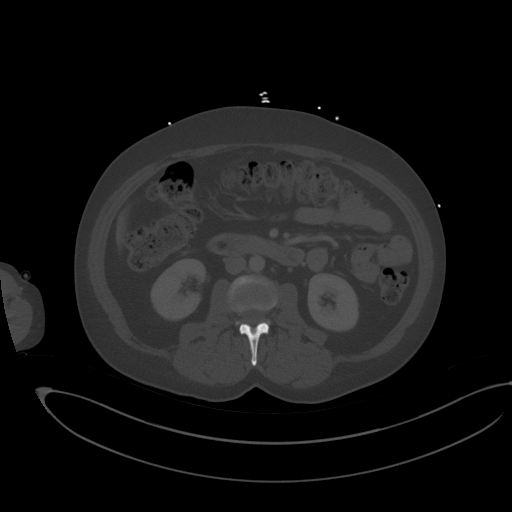
[im 73/104  soft-tissue]
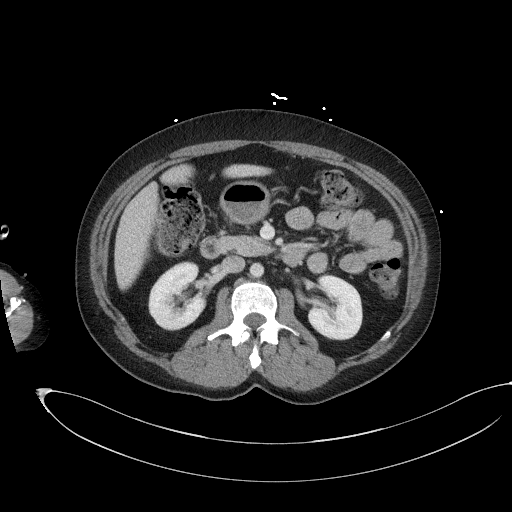
[im 82/104  soft-tissue]
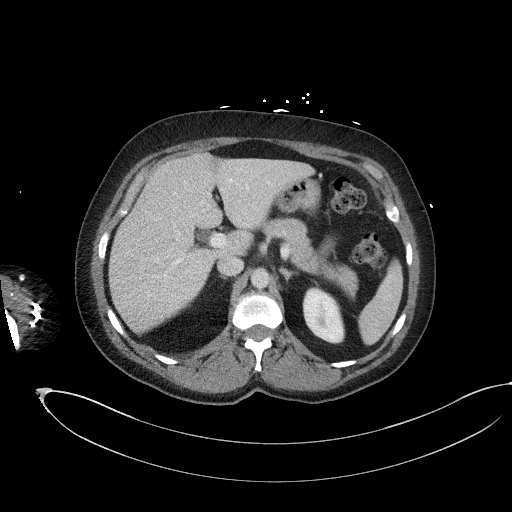
[im 91/104  soft-tissue]
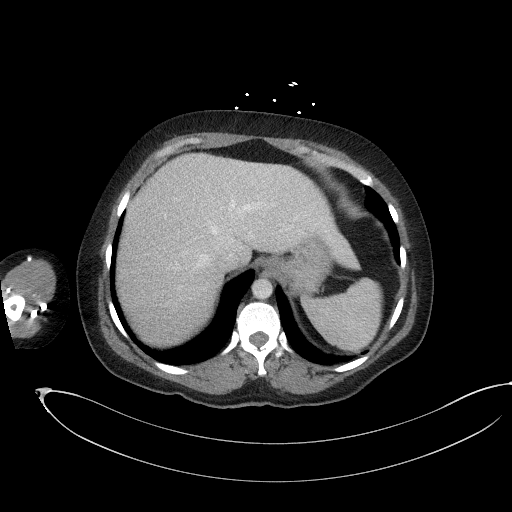
[im 99/104  soft-tissue]
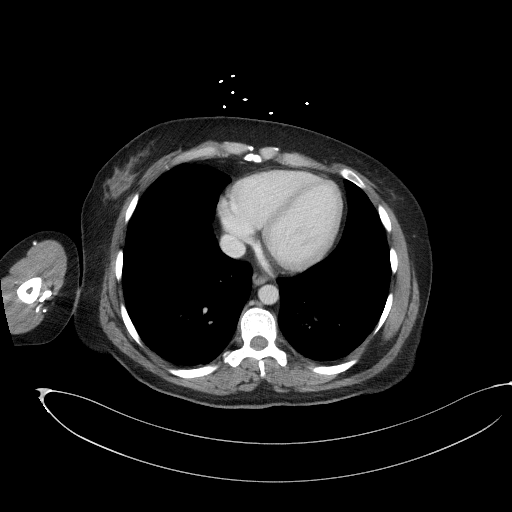

[Series 6: a/p w/ cor · coronal · 0.73mm/px · 3 of 151 slices shown]
[im 51/151  soft-tissue]
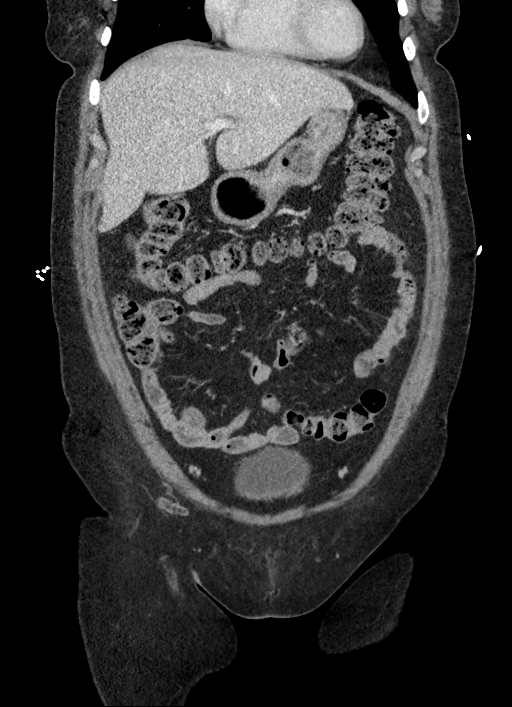
[im 67/151  soft-tissue]
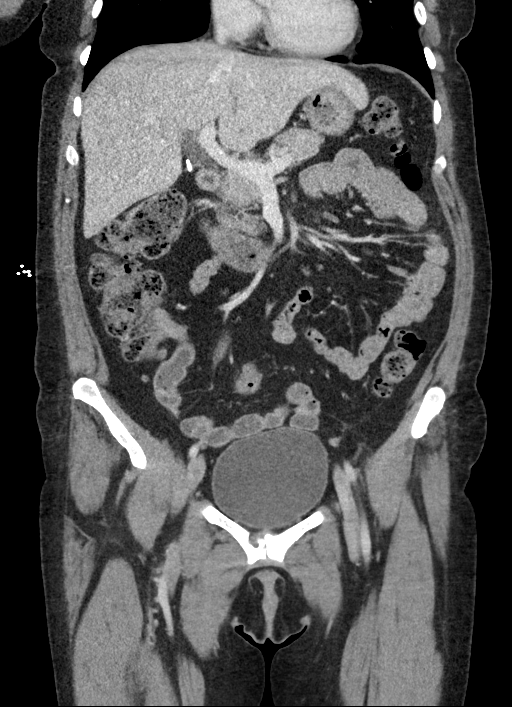
[im 84/151  soft-tissue]
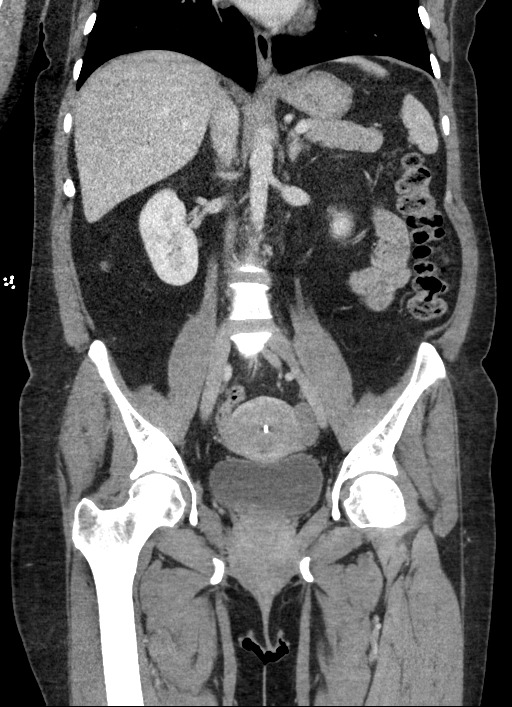

[16 of 46 positions shown; findings below may reference images not displayed]

FINDINGS: Lower chest: No acute abnormality.

Hepatobiliary: No focal liver abnormality is seen. Status post
cholecystectomy. No biliary dilatation.

Pancreas: Unremarkable

Spleen: Unremarkable

Adrenals/Urinary Tract: Adrenal glands are unremarkable. Kidneys are
normal, without renal calculi, focal lesion, or hydronephrosis.
Bladder is unremarkable.

Stomach/Bowel: Stomach is within normal limits. Appendix appears
normal. No evidence of bowel wall thickening, distention, or
inflammatory changes. No free intraperitoneal gas or fluid.

Vascular/Lymphatic: No significant vascular findings are present. No
enlarged abdominal or pelvic lymph nodes.

Reproductive: Intrauterine device in expected position within the
endometrial cavity. Pelvic organs are otherwise unremarkable.

Other: Umbilical hernia repair utilizing mesh has been performed.
Rectum unremarkable.

Musculoskeletal: No acute bone abnormality. No lytic or blastic bone
lesions are identified.
IMPRESSION: No acute intra-abdominal pathology identified. No definite
radiographic explanation for the patient's reported symptoms.

## 2024-01-27 IMAGING — DX DG WRIST COMPLETE 3+V*R*
4 series · 4 of 4 positions shown · non-contrast
Comparison: None Available.

CLINICAL DATA: Pain for 8 months

EXAM:
RIGHT WRIST - COMPLETE 3+ VIEW

[wrist ap]
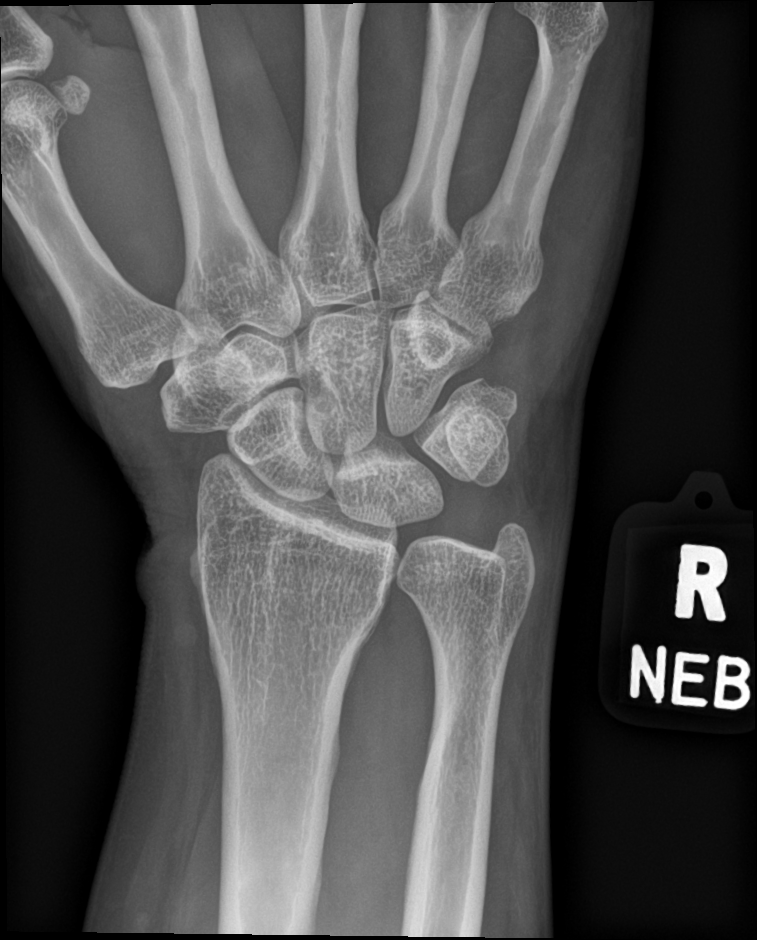

[wrist obl]
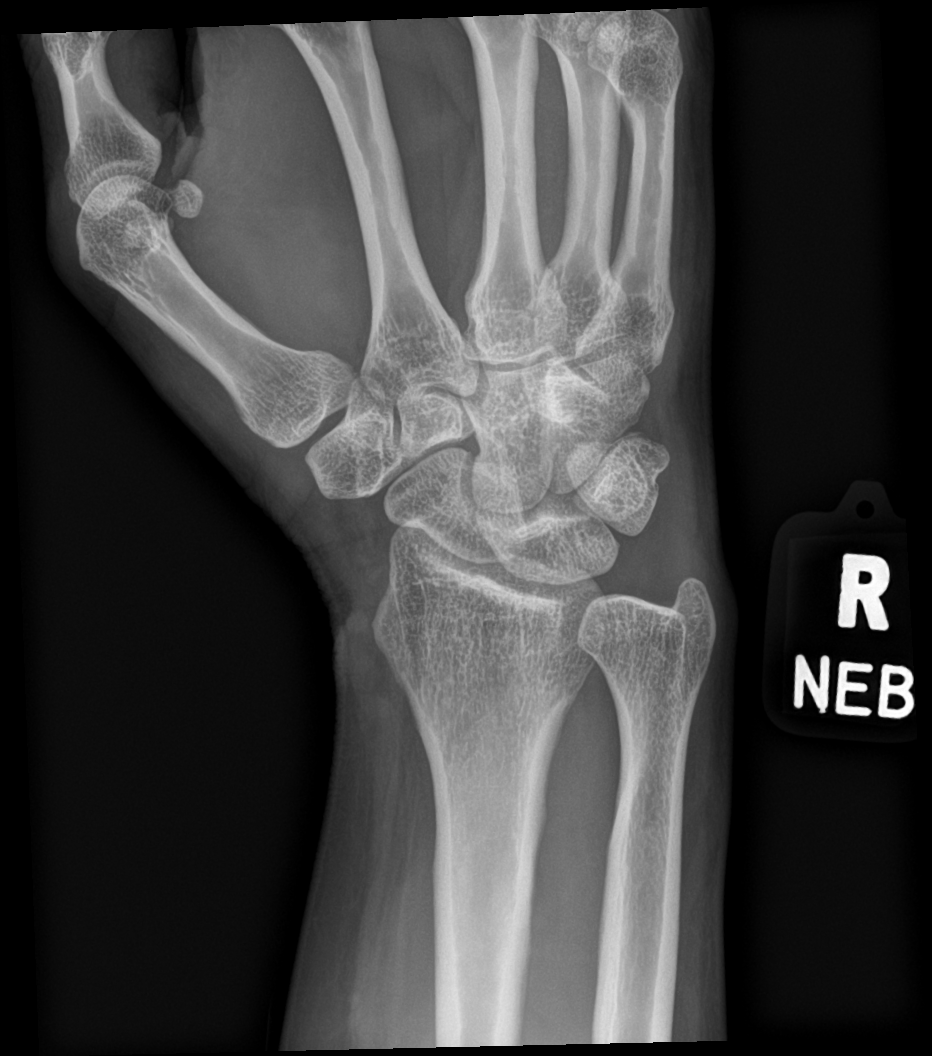

[wrist lat]
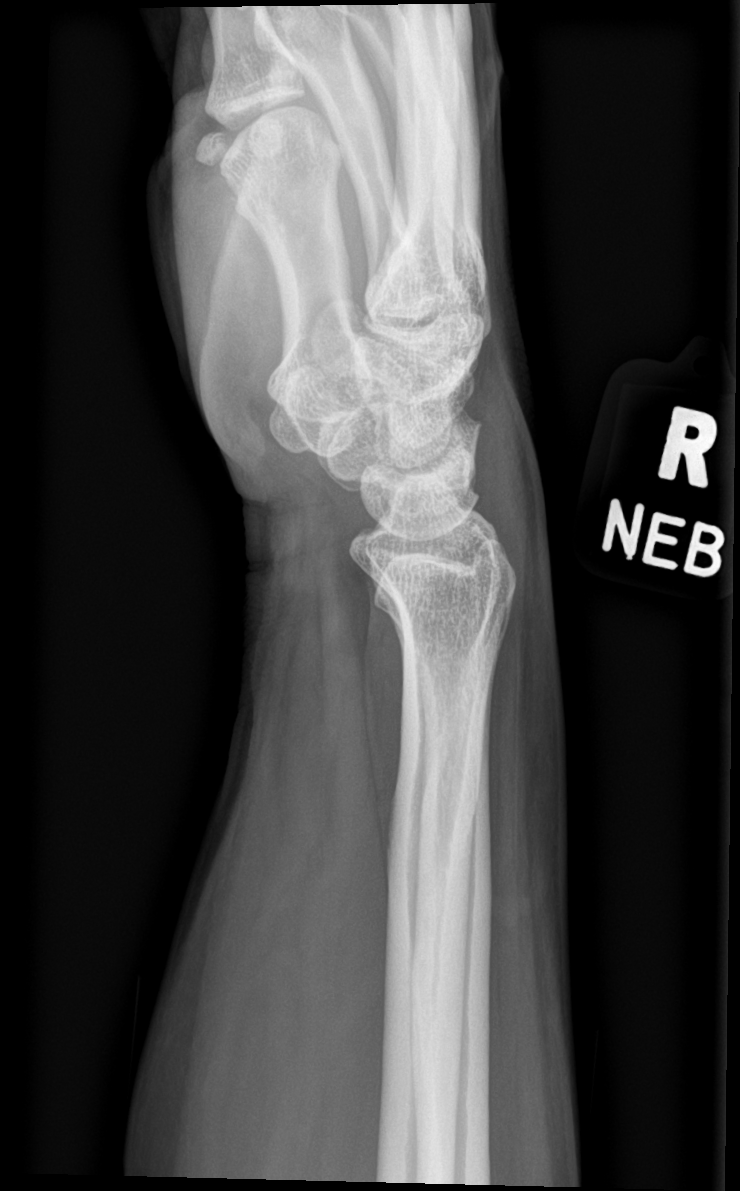

[wrist navicular]
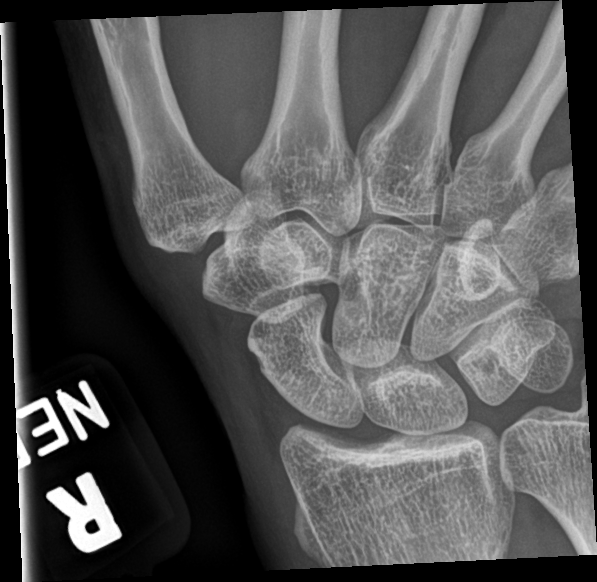

[4 of 4 positions shown; findings below may reference images not displayed]

FINDINGS: No distal radius or ulnar fracture. Radiocarpal joint is intact. No
carpal fracture. No soft tissue abnormality.
IMPRESSION: No acute or chronic osseous abnormality.
